# Patient Record
Sex: Female | Born: 1990 | Hispanic: Yes | Smoking: Former smoker
Health system: Southern US, Community
[De-identification: ages and names within clinical notes are randomized; demographics above are authoritative.]

## PROBLEM LIST (undated history)

## (undated) DIAGNOSIS — I10 Essential (primary) hypertension: Secondary | ICD-10-CM

## (undated) DIAGNOSIS — O24419 Gestational diabetes mellitus in pregnancy, unspecified control: Secondary | ICD-10-CM

## (undated) HISTORY — PX: NO PAST SURGERIES: SHX2092

## (undated) HISTORY — DX: Essential (primary) hypertension: I10

---

## 2017-12-15 NOTE — L&D Delivery Note (Addendum)
Patient is 27 y.o. [id number] admitted for IOL for oligohydramnios, A1GDM. . S/p IOL with foley bulb, cytotec, followed by Pitocin.  Prenatal course also complicated by history of Pre-eclampsia, limited prenatal care. .  Delivery Note At 7:52 AM a viable female was delivered via Vaginal, Spontaneous (Presentation: LOA ; cephalic).  APGAR: 8, 9; weight 6 lb 9.8 oz (3000 g).   Placenta status: placenta delivered precipitously after delivery of baby, indicating likely abruption.  3 vessel Cord:    Head delivered LOA. No nuchal cord present. Shoulder and body delivered in usual fashion. Body came out precipitously after head delivered followed by almost immediate delivery of the placenta.   Infant with spontaneous cry, placed on mother's abdomen, dried and bulb suctioned. Cord clamped x 2, and cut by family member. Cord blood drawn.Fundus firm with massage and Pitocin. Perineum inspected and found to have 2nd degree perineal laceration, which was repaired with 3.0 monocryl with good hemostasis achieved.   Anesthesia:  epidural Episiotomy: None Lacerations: 2nd degree;Perineal Suture Repair: 3.0 monocryl Est. Blood Loss (mL): 350  Mom to postpartum.  Baby to Couplet care / Skin to Skin.  Sandre KittyDaniel K Olson 07/21/2018, 9:56 AM  I confirm that I have verified the information documented in the resident's note and that I have also personally reperformed the physical exam and all medical decision making activities.  I was gloved and present for entire delivery SVD without incident No difficulty with shoulders Lacerations as listed above Repair of same supervised by me Aviva SignsMarie L Madalen Gavin, CNM Please schedule this patient for Postpartum visit in: 1 week with the following provider: Any provider For C/S patients schedule nurse incision check in weeks 2 weeks: no High risk pregnancy complicated by: HTN  And Diabetes Delivery mode:  SVD Anticipated Birth Control:  IUD PP Procedures needed: BP check   Schedule Integrated BH visit: no

## 2018-05-03 ENCOUNTER — Ambulatory Visit (INDEPENDENT_AMBULATORY_CARE_PROVIDER_SITE_OTHER): Payer: Self-pay | Admitting: Advanced Practice Midwife

## 2018-05-03 ENCOUNTER — Encounter: Payer: Self-pay | Admitting: *Deleted

## 2018-05-03 ENCOUNTER — Encounter: Payer: Self-pay | Admitting: Advanced Practice Midwife

## 2018-05-03 DIAGNOSIS — Z23 Encounter for immunization: Secondary | ICD-10-CM

## 2018-05-03 DIAGNOSIS — Z113 Encounter for screening for infections with a predominantly sexual mode of transmission: Secondary | ICD-10-CM

## 2018-05-03 DIAGNOSIS — O0990 Supervision of high risk pregnancy, unspecified, unspecified trimester: Secondary | ICD-10-CM | POA: Insufficient documentation

## 2018-05-03 DIAGNOSIS — O0992 Supervision of high risk pregnancy, unspecified, second trimester: Secondary | ICD-10-CM

## 2018-05-03 DIAGNOSIS — Z8759 Personal history of other complications of pregnancy, childbirth and the puerperium: Secondary | ICD-10-CM | POA: Insufficient documentation

## 2018-05-03 LAB — POCT URINALYSIS DIP (DEVICE)
BILIRUBIN URINE: NEGATIVE
Glucose, UA: NEGATIVE mg/dL
HGB URINE DIPSTICK: NEGATIVE
Ketones, ur: 15 mg/dL — AB
NITRITE: POSITIVE — AB
PH: 6.5 (ref 5.0–8.0)
Protein, ur: NEGATIVE mg/dL
Specific Gravity, Urine: 1.02 (ref 1.005–1.030)
Urobilinogen, UA: 0.2 mg/dL (ref 0.0–1.0)

## 2018-05-03 NOTE — Progress Notes (Signed)
Subjective:   Nichole Watts is a 27 y.o. G3P2002 at [redacted]w[redacted]d by LMP, irregular cycles (will confirm with [place]) being seen today for her first obstetrical visit.  Her obstetrical history is significant for pregnancy induced hypertension. Patient does intend to breast feed. Pregnancy history fully reviewed.  Patient reports no complaints.  HISTORY: OB History  Gravida Para Term Preterm AB Living  0 0 2  SAB TAB Ectopic Multiple Live Births  0 0 0 0 2    # Outcome Date GA Lbr Len/2nd Weight Sex Delivery Anes PTL Lv  3 Current           2 Term 07/16/17 [redacted]w[redacted]d  7 lb 5 oz (3.317 kg) F Vag-Spont None  LIV     Birth Comments: No complications except blood pressure elevated last few weeks of pregnancy  1 Term 07/15/11 [redacted]w[redacted]d  7 lb (3.175 kg) F Vag-Spont None  LIV     Birth Comments: no complications    Last pap smear was done approx one year ago and was normal per patient   Past Medical History:  Diagnosis Date  . Hypertension    during last pregnancy, last few weeks   History reviewed. No pertinent surgical history. Family History  Problem Relation Age of Onset  . Stroke Father   . Hypertension Father    Social History   Tobacco Use  . Smoking status: Never Smoker  . Smokeless tobacco: Never Used  Substance Use Topics  . Alcohol use: Never    Frequency: Never  . Drug use: Yes    Types: Marijuana    Comment: last in 03/2018   No Known Allergies Current Outpatient Medications on File Prior to Visit  Medication Sig Dispense Refill  . Prenatal Multivit-Min-Fe-FA (PRENATAL VITAMINS PO) Take 1 tablet by mouth daily.     No current facility-administered medications on file prior to visit.     Review of Systems Pertinent items noted in HPI and remainder of comprehensive ROS otherwise negative.  Exam   Vitals:   05/03/18 0848 05/03/18 0850  BP: 121/79   Pulse: 89   Weight: 222 lb 14.4 oz (101.1 kg)   Height:   (1.727 m)   Fetal Heart Rate (bpm):  145  Uterus:   23 cm  Pelvic Exam: Perineum: no hemorrhoids, normal perineum   Vulva: normal external genitalia, no lesions   Vagina:  normal mucosa, normal discharge   Cervix:    Adnexa:    Bony Pelvis:   System: General: well-developed, well-nourished female in no acute distress   Breast:  normal appearance, no masses or tenderness   Skin: normal coloration and turgor, no rashes   Neurologic: oriented, normal, negative, normal mood   Extremities: normal strength, tone, and muscle mass, ROM of all joints is normal   HEENT PERRLA, extraocular movement intact and sclera clear, anicteric   Mouth/Teeth mucous membranes moist, pharynx normal without lesions and dental hygiene good   Neck supple and no masses   Cardiovascular: regular rate and rhythm   Respiratory:  no respiratory distress, normal breath sounds   Abdomen: soft, non-tender; bowel sounds normal; no masses,  no organomegaly    Assessment:   Pregnancy: [id number] Patient Active Problem List   Diagnosis Date Noted  . Supervision of high risk pregnancy, antepartum 05/03/2018  . History of pregnancy induced hypertension 05/03/2018     Plan:  1. Supervision of high risk pregnancy, antepartum - [facility] AMB BABYSCRIPTS OPT IN -  Culture, OB Urine - Obstetric Panel, Including HIV - Hemoglobinopathy Evaluation - Flu Vaccine QUAD 36+ mos IM - GC/Chlamydia probe amp ([place])not at [facility] - OB US ordered at the Health Department  - ROI for pap results   2. History of pregnancy induced hypertension - [facility] AMB BABYSCRIPTS OPT IN - Culture, OB Urine - Obstetric Panel, Including HIV - Hemoglobinopathy Evaluation - Flu Vaccine QUAD 36+ mos IM - GC/Chlamydia probe amp ()not at [facility] - CMET - P:cr   Initial labs drawn. Continue prenatal vitamins. Genetic Screening discussed, First trimester screen, Quad screen and NIPS: too late for most, and declines panorama . Ultrasound discussed; fetal anatomic survey:  ordered. Problem list reviewed and updated. The nature of  - [facility] with multiple MDs and other Advanced Practice Providers was explained to patient; also emphasized that residents, students are part of our team. Routine obstetric precautions reviewed. > 50% of 45 min visit spent in counseling and coordination of care. Return in about 1 month (around 05/31/2018).

## 2018-05-03 NOTE — Patient Instructions (Addendum)
Ultrasound: 05/06/18 at the health department  [place], [place], [place] [address] [phone]   Childbirth Education Options: [facility] Department Classes:  Childbirth education classes can help you get ready for a positive parenting experience. You can also meet other expectant parents and get free stuff for your baby. Each class runs for five weeks on the same night and costs $45 for the mother-to-be and her support person. Medicaid covers the cost if you are eligible. Call [phone] to register. [facility] Childbirth Education:  [phone] or [phone] or [email or web address]  Baby & Me Class: Discuss newborn & infant parenting and family adjustment issues with other new mothers in a relaxed environment. Each week brings a new speaker or baby-centered activity. We encourage new mothers to join [place] every Thursday at 11:00am. Babies birth until crawling. No registration or fee. Daddy [employer]: This course offers Dads-to-be the tools and knowledge needed to feel confident on their journey to becoming new fathers. Experienced dads, who have been trained as coaches, teach dads-to-be how to hold, comfort, diaper, swaddle and play with their infant while being able to support the new mom as well. A class for men taught by men. $25/dad Big Brother/Big Sister: Let your children share in the joy of a new brother or sister in this special class designed just for them. Class includes discussion about how families care for babies: swaddling, holding, diapering, safety as well as how they can be helpful in their new role. This class is designed for children ages 63 to 7, but any age is welcome. Please register each child individually. $5/child  Mom Talk: This mom-led group offers support and connection to mothers as they journey through the adjustments and struggles of that sometimes overwhelming first year after the birth of a child. Tuesdays at 10:00am and Thursdays at  6:00pm. Babies welcome. No registration or fee. Breastfeeding Support Group: This group is a mother-to-mother support circle where moms have the opportunity to share their breastfeeding experiences. A Lactation Consultant is present for questions and concerns. Meets each Tuesday at 11:00am. No fee or registration. Breastfeeding Your Baby: Learn what to expect in the first days of breastfeeding your newborn.  This class will help you feel more confident with the skills needed to begin your breastfeeding experience. Many new mothers are concerned about breastfeeding after leaving the hospital. This class will also address the most common fears and challenges about breastfeeding during the first few weeks, months and beyond. (call for fee) Comfort Techniques and Tour: This 2 hour interactive class will provide you the opportunity to learn & practice hands-on techniques that can help relieve some of the discomfort of labor and encourage your baby to rotate toward the best position for birth. You and your partner will be able to try a variety of labor positions with birth balls and rebozos as well as practice breathing, relaxation, and visualization techniques. A tour of the [facility] is included with this class. $20 per registrant and support person Childbirth Class- Weekend Option: This class is a Weekend version of our Birth & Baby series. It is designed for parents who have a difficult time fitting several weeks of classes into their schedule. It covers the care of your newborn and the basics of labor and childbirth. It also includes a Love of [facility] and lunch. The class is held two consecutive days: beginning on Friday evening from 6:30 - 8:30 p.m. and the next day,  Saturday from 9 a.m. - 4 p.m. (call for fee) Waterbirth Class: Interested in a waterbirth?  This informational class will help you discover whether waterbirth is the right fit for you.  Education about waterbirth itself, supplies you would need and how to assemble your support team is what you can expect from this class. Some obstetrical practices require this class in order to pursue a waterbirth. (Not all obstetrical practices offer waterbirth-check with your healthcare provider.) Register only the expectant mom, but you are encouraged to bring your partner to class! Required if planning waterbirth, no fee. Infant/Child CPR: Parents, grandparents, babysitters, and friends learn Cardio-Pulmonary Resuscitation skills for infants and children. You will also learn how to treat both conscious and unconscious choking in infants and children. This Family & Friends program does not offer certification. Register each participant individually to ensure that enough mannequins are available. (Call for fee) Grandparent Love: Expecting a grandbaby? This class is for you! Learn about the latest infant care and safety recommendations and ways to support your own child as he or she transitions into the parenting role. Taught by Registered Nurses who are childbirth instructors, but most importantly...they are grandmothers too! $10/person. Childbirth Class- Natural Childbirth: This series of 5 weekly classes is for expectant parents who want to learn and practice natural methods of coping with the process of labor and childbirth. Relaxation, breathing, massage, visualization, role of the partner, and helpful positioning are highlighted. Participants learn how to be confident in their body's ability to give birth. This class will empower and help parents make informed decisions about their own care. Includes discussion that will help new parents transition into the immediate postpartum period. [facility] is included. We suggest taking this class between 25-32 weeks, but it's only a recommendation. $75 per registrant and one support person or $30 Medicaid. Childbirth Class- 3  week Series: This option of 3 weekly classes helps you and your labor partner prepare for childbirth. Newborn care, labor & birth, cesarean birth, pain management, and comfort techniques are discussed and a Marshall of [facility] is included. The class meets at the same time, on the same day of the week for 3 consecutive weeks beginning with the starting date you choose. $60 for registrant and one support person.  Marvelous Multiples: Expecting twins, triplets, or more? This class covers the differences in labor, birth, parenting, and breastfeeding issues that face multiples' parents. NICU tour is included. Led by a Certified Childbirth Educator who is the mother of twins. No fee. Caring for Baby: This class is for expectant and adoptive parents who want to learn and practice the most up-to-date newborn care for their babies. Focus is on birth through the first six weeks of life. Topics include feeding, bathing, diapering, crying, umbilical cord care, circumcision care and safe sleep. Parents learn to recognize symptoms of illness and when to call the pediatrician. Register only the mom-to-be and your partner or support person can plan to come with you! $10 per registrant and support person Childbirth Class- online option: This online class offers you the freedom to complete a Birth and Baby series in the comfort of your own home. The flexibility of this option allows you to review sections at your own pace, at times convenient to you and your support people. It includes additional video information, animations, quizzes, and extended activities. Get organized with helpful eClass tools, checklists, and trackers. Once you register online for the class, you will  receive an email within a few days to accept the invitation and begin the class when the time is right for you. The content will be available to you for 60 days. $60 for 60 days of online access for you and your support  people.  Local Doulas: Natural Baby Doulas [email or web address] Tel: [phone] [email or web address] [employer] [phone] [email or web address] .[email or web address] www.[email or web address].com The Labor Hassell Halim  (also do waterbirth tub rental) [phone] [email or web address] Triad Birth Doula [phone] [email or web address] NotebookDistributors.fi Sacred Rhythms  [phone] https://sacred-[email or web address]/ [employer] (PADA) [email or web address] https://www.[email or web address]/ [place] Birth and Baby  http://[email or web address]/ Considering Waterbirth? Guide for patients at [facility] [employer]  Why consider waterbirth?  . Gentle birth for babies . Less pain medicine used in labor . May allow for passive descent/less pushing . May reduce perineal tears  . More mobility and instinctive maternal position changes . Increased maternal relaxation . Reduced blood pressure in labor  Is waterbirth safe? What are the risks of infection, drowning or other complications?  . Infection: o Very low risk (3.7 % for tub vs 4.8% for bed) o 7 in 8000 waterbirths with documented infection o Poorly cleaned equipment most common cause o Slightly lower group B strep transmission rate  . Drowning o Maternal:  - Very low risk   - Related to seizures or fainting o Newborn:  - Very low risk. No evidence of increased risk of respiratory problems in multiple large studies - Physiological protection from breathing under water - Avoid underwater birth if there are any fetal complications - Once baby's head is out of the water, keep it out.  . Birth complication o Some reports of cord trauma, but risk decreased by bringing baby to surface gradually o No evidence of increased risk of shoulder dystocia. Mothers can usually change positions faster in water than in a bed, possibly aiding the  maneuvers to free the shoulder.   You must attend a Doren Custard class at [facility]  3rd Wednesday of every month from 7-9pm  [employer] by calling [phone] or online at [email or web address]  Bring [place] the certificate from the class to your prenatal appointment  Meet with a midwife at 36 weeks to see if you can still plan a waterbirth and to sign the consent.   Purchase or rent the following supplies:   Water Birth Pool (Birth Pool in a Box or [place] for instance)  (Tubs start ~$125)  Single-use disposable tub liner designed for your brand of tub  New garden hose labeled "lead-free", "suitable for drinking water",  Electric drain pump to remove water (We recommend 792 gallon per hour or greater pump.)   Separate garden hose to remove the dirty water  Fish net  Bathing suit top (optional)  Long-handled mirror (optional)  Places to purchase or rent supplies  GotWebTools.is for tub purchases and supplies  Waterbirthsolutions.com for tub purchases and supplies  The Labor Ladies (www.thelaborladies.com) $275 for tub rental/set-up & take down/kit   [employer] (http://www.fleming.com/.htm) Information regarding doulas (labor support) who provide pool rentals  Our practice has a Birth Pool in a Box tub at the hospital that you may borrow on a first-come-first-served basis. It is your responsibility to to set up, clean and break down the tub. We cannot guarantee the availability of this tub in advance. You are responsible for bringing all accessories listed above. If you do not have all necessary supplies you cannot have a waterbirth.    Things that would prevent you from having a  waterbirth:  Premature, <37wks  Previous cesarean birth  Presence of thick meconium-stained fluid  Multiple gestation (Twins, triplets, etc.)  Uncontrolled diabetes or gestational diabetes requiring medication  Hypertension requiring medication  or diagnosis of pre-eclampsia  Heavy vaginal bleeding  Non-reassuring fetal heart rate  Active infection (MRSA, etc.). Group B Strep is NOT a contraindication for  waterbirth.  If your labor has to be induced and induction method requires continuous  monitoring of the baby's heart rate  Other risks/issues identified by your obstetrical provider  Please remember that birth is unpredictable. Under certain unforeseeable circumstances your provider may advise against giving birth in the tub. These decisions will be made on a case-by-case basis and with the safety of you and your baby as our highest priority.

## 2018-05-03 NOTE — Progress Notes (Signed)
Here for new ob. Given new pregnancy packet. Sent MyChart text to sign up. Signed up for Babyscripts app only. Scheduled [place] at [facility] 05/06/18 10:00.

## 2018-05-04 LAB — GC/CHLAMYDIA PROBE AMP (~~LOC~~) NOT AT ARMC
CHLAMYDIA, DNA PROBE: NEGATIVE
Neisseria Gonorrhea: NEGATIVE

## 2018-05-05 LAB — OBSTETRIC PANEL, INCLUDING HIV
Antibody Screen: NEGATIVE
BASOS: 0 %
Basophils Absolute: 0 10*3/uL (ref 0.0–0.2)
EOS (ABSOLUTE): 0.1 10*3/uL (ref 0.0–0.4)
Eos: 1 %
HEMATOCRIT: 39.4 % (ref 34.0–46.6)
HEP B S AG: NEGATIVE
HIV Screen 4th Generation wRfx: NONREACTIVE
Hemoglobin: 13.3 g/dL (ref 11.1–15.9)
IMMATURE GRANS (ABS): 0.1 10*3/uL (ref 0.0–0.1)
IMMATURE GRANULOCYTES: 1 %
LYMPHS ABS: 1.8 10*3/uL (ref 0.7–3.1)
Lymphs: 16 %
MCH: 30.1 pg (ref 26.6–33.0)
MCHC: 33.8 g/dL (ref 31.5–35.7)
MCV: 89 fL (ref 79–97)
MONOCYTES: 8 %
Monocytes Absolute: 0.9 10*3/uL (ref 0.1–0.9)
NEUTROS ABS: 8.3 10*3/uL — AB (ref 1.4–7.0)
Neutrophils: 74 %
PLATELETS: 292 10*3/uL (ref 150–450)
RBC: 4.42 x10E6/uL (ref 3.77–5.28)
RDW: 13.2 % (ref 12.3–15.4)
RPR: NONREACTIVE
RUBELLA: 1.65 {index} (ref >0.99)
Rh Factor: POSITIVE
WBC: 11.3 10*3/uL — ABNORMAL HIGH (ref 3.4–10.8)

## 2018-05-05 LAB — COMPREHENSIVE METABOLIC PANEL
ALBUMIN: 3.6 g/dL (ref 3.5–5.5)
ALK PHOS: 87 IU/L (ref 39–117)
ALT: 13 IU/L (ref 0–32)
AST: 11 IU/L (ref 0–40)
Albumin/Globulin Ratio: 1.3 (ref 1.2–2.2)
BUN / CREAT RATIO: 13 (ref 9–23)
BUN: 7 mg/dL (ref 6–20)
Bilirubin Total: 0.2 mg/dL (ref 0.0–1.2)
CALCIUM: 9.1 mg/dL (ref 8.7–10.2)
CO2: 18 mmol/L — AB (ref 20–29)
CREATININE: 0.52 mg/dL — AB (ref 0.57–1.00)
Chloride: 106 mmol/L (ref 96–106)
GFR, EST AFRICAN AMERICAN: 152 mL/min/{1.73_m2} (ref >59)
GFR, EST NON AFRICAN AMERICAN: 132 mL/min/{1.73_m2} (ref >59)
GLUCOSE: 90 mg/dL (ref 65–99)
Globulin, Total: 2.8 g/dL (ref 1.5–4.5)
Potassium: 4.1 mmol/L (ref 3.5–5.2)
Sodium: 139 mmol/L (ref 134–144)
TOTAL PROTEIN: 6.4 g/dL (ref 6.0–8.5)

## 2018-05-05 LAB — HEMOGLOBINOPATHY EVALUATION
Ferritin: 20 ng/mL (ref 15–150)
HGB A: 97.6 % (ref 96.4–98.8)
HGB F QUANT: 0 % (ref 0.0–2.0)
HGB SOLUBILITY: NEGATIVE
Hgb A2 Quant: 2.4 % (ref 1.8–3.2)
Hgb C: 0 %
Hgb S: 0 %
Hgb Variant: 0 %

## 2018-05-05 LAB — PROTEIN / CREATININE RATIO, URINE
CREATININE, UR: 113.3 mg/dL
Protein, Ur: 17.9 mg/dL
Protein/Creat Ratio: 158 mg/g creat (ref 0–200)

## 2018-05-07 LAB — URINE CULTURE, OB REFLEX

## 2018-05-07 LAB — CULTURE, OB URINE

## 2018-05-07 MED ORDER — CEPHALEXIN 500 MG PO CAPS: 500.0000 mg | ORAL_CAPSULE | Freq: Four times a day (QID) | ORAL | 0 refills | Status: DC

## 2018-05-07 NOTE — Addendum Note (Signed)
Addended by: Thressa Sheller D on: 05/07/2018 08:06 PM   Modules accepted: Orders

## 2018-05-21 ENCOUNTER — Encounter: Payer: Self-pay | Admitting: *Deleted

## 2018-05-31 ENCOUNTER — Other Ambulatory Visit: Payer: Self-pay

## 2018-05-31 ENCOUNTER — Ambulatory Visit (INDEPENDENT_AMBULATORY_CARE_PROVIDER_SITE_OTHER): Payer: Self-pay | Admitting: Advanced Practice Midwife

## 2018-05-31 VITALS — BP 124/79 | HR 89 | Wt 226.0 lb

## 2018-05-31 DIAGNOSIS — O0990 Supervision of high risk pregnancy, unspecified, unspecified trimester: Secondary | ICD-10-CM

## 2018-05-31 DIAGNOSIS — O0993 Supervision of high risk pregnancy, unspecified, third trimester: Secondary | ICD-10-CM

## 2018-05-31 DIAGNOSIS — Z23 Encounter for immunization: Secondary | ICD-10-CM

## 2018-05-31 DIAGNOSIS — Z8759 Personal history of other complications of pregnancy, childbirth and the puerperium: Secondary | ICD-10-CM

## 2018-05-31 NOTE — Patient Instructions (Signed)
Tdap Vaccine (Tetanus, Diphtheria and Pertussis): What You Need to Know 1. Why get vaccinated? Tetanus, diphtheria and pertussis are very serious diseases. Tdap vaccine can protect us from these diseases. And, Tdap vaccine given to pregnant women can protect newborn babies against pertussis. TETANUS (Lockjaw) is rare in the [place] today. It causes painful muscle tightening and stiffness, usually all over the body.  It can lead to tightening of muscles in the head and neck so you can't open your mouth, swallow, or sometimes even breathe. Tetanus kills about 1 out of 10 people who are infected even after receiving the best medical care.  DIPHTHERIA is also rare in the [place] today. It can cause a thick coating to form in the back of the throat.  It can lead to breathing problems, heart failure, paralysis, and death.  PERTUSSIS (Whooping Cough) causes severe coughing spells, which can cause difficulty breathing, vomiting and disturbed sleep.  It can also lead to weight loss, incontinence, and rib fractures. Up to 2 in 100 adolescents and 5 in 100 adults with pertussis are hospitalized or have complications, which could include pneumonia or death.  These diseases are caused by bacteria. Diphtheria and pertussis are spread from person to person through secretions from coughing or sneezing. Tetanus enters the body through cuts, scratches, or wounds. Before vaccines, as many as 200,000 cases of diphtheria, 200,000 cases of pertussis, and hundreds of cases of tetanus, were reported in the [place] each year. Since vaccination began, reports of cases for tetanus and diphtheria have dropped by about 99% and for pertussis by about 80%. 2. Tdap vaccine Tdap vaccine can protect adolescents and adults from tetanus, diphtheria, and pertussis. One dose of Tdap is routinely given at age 11 or 12. People who did not get Tdap at that age should get it as soon as possible. Tdap is especially  important for healthcare professionals and anyone having close contact with a baby younger than 12 months. Pregnant women should get a dose of Tdap during every pregnancy, to protect the newborn from pertussis. Infants are most at risk for severe, life-threatening complications from pertussis. Another vaccine, called Td, protects against tetanus and diphtheria, but not pertussis. A Td booster should be given every 10 years. Tdap may be given as one of these boosters if you have never gotten Tdap before. Tdap may also be given after a severe cut or burn to prevent tetanus infection. Your doctor or the person giving you the vaccine can give you more information. Tdap may safely be given at the same time as other vaccines. 3. Some people should not get this vaccine  A person who has ever had a life-threatening allergic reaction after a previous dose of any diphtheria, tetanus or pertussis containing vaccine, OR has a severe allergy to any part of this vaccine, should not get Tdap vaccine. Tell the person giving the vaccine about any severe allergies.  Anyone who had coma or long repeated seizures within 7 days after a childhood dose of DTP or DTaP, or a previous dose of Tdap, should not get Tdap, unless a cause other than the vaccine was found. They can still get Td.  Talk to your doctor if you: ? have seizures or another nervous system problem, ? had severe pain or swelling after any vaccine containing diphtheria, tetanus or pertussis, ? ever had a condition called Guillain-Barr Syndrome (GBS), ? aren't feeling well on the day the shot is scheduled. 4. Risks With any medicine, including   vaccines, there is a chance of side effects. These are usually mild and go away on their own. Serious reactions are also possible but are rare. Most people who get Tdap vaccine do not have any problems with it. Mild problems following Tdap: (Did not interfere with activities)  Pain where the shot was given (about  3 in 4 adolescents or 2 in 3 adults)  Redness or swelling where the shot was given (about 1 person in 5)  Mild fever of at least 100.4F (up to about 1 in 25 adolescents or 1 in 100 adults)  Headache (about 3 or 4 people in 10)  Tiredness (about 1 person in 3 or 4)  Nausea, vomiting, diarrhea, stomach ache (up to 1 in 4 adolescents or 1 in 10 adults)  Chills, sore joints (about 1 person in 10)  Body aches (about 1 person in 3 or 4)  Rash, swollen glands (uncommon)  Moderate problems following Tdap: (Interfered with activities, but did not require medical attention)  Pain where the shot was given (up to 1 in 5 or 6)  Redness or swelling where the shot was given (up to about 1 in 16 adolescents or 1 in 12 adults)  Fever over 102F (about 1 in 100 adolescents or 1 in 250 adults)  Headache (about 1 in 7 adolescents or 1 in 10 adults)  Nausea, vomiting, diarrhea, stomach ache (up to 1 or 3 people in 100)  Swelling of the entire arm where the shot was given (up to about 1 in 500).  Severe problems following Tdap: (Unable to perform usual activities; required medical attention)  Swelling, severe pain, bleeding and redness in the arm where the shot was given (rare).  Problems that could happen after any vaccine:  People sometimes faint after a medical procedure, including vaccination. Sitting or lying down for about 15 minutes can help prevent fainting, and injuries caused by a fall. Tell your doctor if you feel dizzy, or have vision changes or ringing in the ears.  Some people get severe pain in the shoulder and have difficulty moving the arm where a shot was given. This happens very rarely.  Any medication can cause a severe allergic reaction. Such reactions from a vaccine are very rare, estimated at fewer than 1 in a million doses, and would happen within a few minutes to a few hours after the vaccination. As with any medicine, there is a very remote chance of a vaccine  causing a serious injury or death. The safety of vaccines is always being monitored. For more information, visit: www.cdc.gov/vaccinesafety/ 5. What if there is a serious problem? What should I look for? Look for anything that concerns you, such as signs of a severe allergic reaction, very high fever, or unusual behavior. Signs of a severe allergic reaction can include hives, swelling of the face and throat, difficulty breathing, a fast heartbeat, dizziness, and weakness. These would usually start a few minutes to a few hours after the vaccination. What should I do?  If you think it is a severe allergic reaction or other emergency that can't wait, call 9-1-1 or get the person to the nearest hospital. Otherwise, call your doctor.  Afterward, the reaction should be reported to the Vaccine Adverse Event Reporting System (VAERS). Your doctor might file this report, or you can do it yourself through the VAERS web site at www.vaers.hhs.gov, or by calling 1-800-822-7967. ? VAERS does not give medical advice. 6. The National Vaccine Injury Compensation Program The National   Vaccine Injury Compensation Program (VICP) is a federal program that was created to compensate people who may have been injured by certain vaccines. Persons who believe they may have been injured by a vaccine can learn about the program and about filing a claim by calling 1-579-464-1416 or visiting the VICP website at SpiritualWord.at. There is a time limit to file a claim for compensation. 7. How can I learn more?  Ask your doctor. He or she can give you the vaccine package insert or suggest other sources of information.  Call your local or state health department.  Contact the Centers for Disease Control and Prevention (CDC): ? Call 432-320-9752 (1-800-CDC-INFO) or ? Visit CDC's website at PicCapture.uy CDC Tdap Vaccine VIS (02/07/14) This information is not intended to replace advice given to you by your  health care provider. Make sure you discuss any questions you have with your health care provider. Document Released: 06/01/2012 Document Revised: 08/21/2016 Document Reviewed: 08/21/2016 Elsevier Interactive Patient Education  2017 [employer].  BENEFITS OF BREASTFEEDING Many women wonder if they should breastfeed. Research shows that breast milk contains the perfect balance of vitamins, protein and fat that your baby needs to grow. It also contains antibodies that help your baby's immune system to fight off viruses and bacteria and can reduce the risk of sudden infant death syndrome (SIDS). In addition, the colostrum (a fluid secreted from the breast in the first few days after delivery) helps your newborn's digestive system to grow and function well. Breast milk is easier to digest than formula. Also, if your baby is born preterm, breast milk can help to reduce both short- and long-term health problems. BENEFITS OF BREASTFEEDING FOR MOM . Breastfeeding causes a hormone to be released that helps the uterus to contract and return to its normal size more quickly. . It aids in postpartum weight loss, reduces risk of breast and ovarian cancer, heart disease and rheumatoid arthritis. . It decreases the amount of bleeding after the baby is born. benefits of breastfeeding for baby . Provides comfort and nutrition . Protects baby against - Obesity - Diabetes - Asthma - Childhood cancers - Heart disease - Ear infections - Diarrhea - Pneumonia - Stomach problems - Serious allergies - Skin rashes . Promotes growth and development . Reduces the risk of baby having Sudden Infant Death Syndrome (SIDS) only breastmilk for the first 6 months . Protects baby against diseases/allergies . It's the perfect amount for tiny bellies . It restores baby's energy . Provides the best nutrition for baby . Giving water or formula can make baby more likely to get sick, decrease Mom's milk supply, make baby  less content with breastfeeding Skin to Skin After delivery, the staff will place your baby on your chest. This helps with the following: . Regulates baby's temperature, breathing, heart rate and blood sugar . Increases Mom's milk supply . Promotes bonding . Keeps baby and Mom calm and decreases baby's crying Rooming In Your baby will stay in your room with you for the entire time you are in the hospital. This helps with the following: . Allows Mom to learn baby's feeding cues - Fluttering eyes - Sucking on tongue or hand - Rooting (opens mouth and turns head) - Nuzzling into the breast - Bringing hand to mouth . Allows breastfeeding on demand (when your baby is ready) . Helps baby to be calm and content . Ensures a good milk supply . Prevents complications with breastfeeding . Allows parents to learn to care for  baby . Allows you to request assistance with breastfeeding Importance of a good latch . Increases milk transfer to baby - baby gets enough milk . Ensures you have enough milk for your baby . Decreases nipple soreness . Don't use pacifiers and bottles - these cause baby to suck differently than breastfeeding . Promotes continuation of breastfeeding Risks of Formula Supplementation with Breastfeeding Giving your infant formula in addition to your breast-milk EXCEPT when medically necessary can lead to: Marland Kitchen Decreases your milk supply  . Loss of confidence in yourself for providing baby's nutrition  . Engorgement and possibly mastitis  . Asthma & allergies in the baby BREASTFEEDING FAQS How long should I breastfeed my baby? It is recommended that you provide your baby with breast milk only for the first 6 months and then continue for the first year and longer as desired. During the first few weeks after birth, your baby will need to feed 8-12 times every 24 hours, or every 2-3 hours. They will likely feed for 15-30 minutes. How can I help my baby begin breastfeeding? Babies  are born with an instinct to breastfeed. A healthy baby can begin breastfeeding right away without specific help. At the hospital, a nurse (or lactation consultant) will help you begin the process and will give you tips on good positioning. It may be helpful to take a breastfeeding class before you deliver in order to know what to expect. How can I help my baby latch on? In order to assist your baby in latching-on, cup your breast in your hand and stroke your baby's lower lip with your nipple to stimulate your baby's rooting reflex. Your baby will look like he or she is yawning, at which point you should bring the baby towards your breast, while aiming the nipple at the roof of his or her mouth. Remember to bring the baby towards you and not your breast towards the baby. How can I tell if my baby is latched-on? Your baby will have all of your nipple and part of the dark area around the nipple in his or her mouth and your baby's nose will be touching your breast. You should see or hear the baby swallowing. If the baby is not latched-on properly, start the process over. To remove the suction, insert a clean finger between your breast and the baby's mouth. Should I switch breasts during feeding? After feeding on one side, switch the baby to your other breast. If he or she does not continue feeding - that is OK. Your baby will not necessarily need to feed from both breasts in a single feeding. On the next feeding, start with the other breast for efficiency and comfort. How can I tell if my baby is hungry? When your baby is hungry, they will nuzzle against your breast, make sucking noises and tongue motions and may put their hands near their mouth. Crying is a late sign of hunger, so you should not wait until this point. When they have received enough milk, they will unlatch from the breast. Is it okay to use a pacifier? Until your baby gets the hang of breastfeeding, experts recommend limiting pacifier usage.  If you have questions about this, please contact your pediatrician. What can I do to ensure proper nutrition while breastfeeding? . Make sure that you support your own health and your baby's by eating a healthy, well-balanced diet . Your provider may recommend that you continue to take your prenatal vitamin . Drink plenty of fluids. It  is a good rule to drink one glass of water before or after feeding . Alcohol will remain in the breast milk for as long as it will remain in the blood stream. If you choose to have a drink, it is recommended that you wait at least 2 hours before feeding . Moderate amounts of caffeine are OK . Some over-the-counter or prescription medications are not recommended during breastfeeding. Check with your provider if you have questions What types of birth control methods are safe while breastfeeding? Progestin-only methods, including a daily pill, an IUD, the implant and the injection are safe while breastfeeding. Methods that contain estrogen (such as combination birth control pills, the vaginal ring and the patch) should not be used during the first month of breastfeeding as these can decrease your milk supply.

## 2018-05-31 NOTE — Progress Notes (Signed)
   PRENATAL VISIT NOTE  Subjective:  Nichole Watts is a 27 y.o. G3P2002 at 2283w6d being seen today for ongoing prenatal care.  She is currently monitored for the following issues for this low-risk pregnancy and has Supervision of high risk pregnancy, antepartum and History of pregnancy induced hypertension on their problem list.  Patient reports no complaints.  Contractions: Not present. Vag. Bleeding: None.  Movement: Present. Denies leaking of fluid.   The following portions of the patient's history were reviewed and updated as appropriate: allergies, current medications, past family history, past medical history, past social history, past surgical history and problem list. Problem list updated.  Objective:   Vitals:   05/31/18 1121  BP: 124/79  Pulse: 89  Weight: 226 lb (102.5 kg)    Fetal Status: Fetal Heart Rate (bpm): 145 Fundal Height: 30 cm Movement: Present     General:  Alert, oriented and cooperative. Patient is in no acute distress.  Skin: Skin is warm and dry. No rash noted.   Cardiovascular: Normal heart rate noted  Respiratory: Normal respiratory effort, no problems with respiration noted  Abdomen: Soft, gravid, appropriate for gestational age.  Pain/Pressure: Present     Pelvic: Cervical exam deferred        Extremities: Normal range of motion.  Edema: None  Mental Status: Normal mood and affect. Normal behavior. Normal judgment and thought content.   Assessment and Plan:  Pregnancy: G3P2002 at 7483w6d  1. Supervision of high risk pregnancy, antepartum - Tdap vaccine greater than or equal to 7yo IM - 28 week labs today   2. History of pregnancy induced hypertension - [facility] - Patient reports that she isn't taking ASA daily. States, "I take it sometimes". DW patient the rationale for this medication and encouraged her to be more consistent with taking it.    Preterm labor symptoms and general obstetric precautions including but not limited  to vaginal bleeding, contractions, leaking of fluid and fetal movement were reviewed in detail with the patient. Please refer to After Visit Summary for other counseling recommendations.  Return in about 2 weeks (around 06/14/2018).  No future appointments.  Thressa ShellerHeather Keyira Mondesir, CNM

## 2018-06-01 LAB — CBC
Hematocrit: 38.2 % (ref 34.0–46.6)
Hemoglobin: 13.2 g/dL (ref 11.1–15.9)
MCH: 30 pg (ref 26.6–33.0)
MCHC: 34.6 g/dL (ref 31.5–35.7)
MCV: 87 fL (ref 79–97)
PLATELETS: 310 10*3/uL (ref 150–450)
RBC: 4.4 x10E6/uL (ref 3.77–5.28)
RDW: 13.5 % (ref 12.3–15.4)
WBC: 9 10*3/uL (ref 3.4–10.8)

## 2018-06-01 LAB — GLUCOSE TOLERANCE, 2 HOURS W/ 1HR
Glucose, 1 hour: 106 mg/dL (ref 65–179)
Glucose, 2 hour: 99 mg/dL (ref 65–152)
Glucose, Fasting: 99 mg/dL — ABNORMAL HIGH (ref 65–91)

## 2018-06-01 LAB — RPR: RPR: NONREACTIVE

## 2018-06-01 LAB — HIV ANTIBODY (ROUTINE TESTING W REFLEX): HIV Screen 4th Generation wRfx: NONREACTIVE

## 2018-06-03 ENCOUNTER — Other Ambulatory Visit: Payer: Self-pay

## 2018-06-14 ENCOUNTER — Encounter: Payer: Self-pay | Admitting: Obstetrics & Gynecology

## 2018-06-21 ENCOUNTER — Encounter: Payer: Self-pay | Admitting: Advanced Practice Midwife

## 2018-06-21 ENCOUNTER — Ambulatory Visit (INDEPENDENT_AMBULATORY_CARE_PROVIDER_SITE_OTHER): Payer: Self-pay | Admitting: Family Medicine

## 2018-06-21 VITALS — BP 115/81 | HR 109 | Wt 230.6 lb

## 2018-06-21 DIAGNOSIS — Z8759 Personal history of other complications of pregnancy, childbirth and the puerperium: Secondary | ICD-10-CM

## 2018-06-21 DIAGNOSIS — O0993 Supervision of high risk pregnancy, unspecified, third trimester: Secondary | ICD-10-CM

## 2018-06-21 DIAGNOSIS — O0990 Supervision of high risk pregnancy, unspecified, unspecified trimester: Secondary | ICD-10-CM

## 2018-06-21 NOTE — Progress Notes (Signed)
   PRENATAL VISIT NOTE  Subjective:  Ranelle Oysterataly Ibarra Rodriguez is a 27 y.o. G3P2002 at 7853w6d being seen today for ongoing prenatal care.  She is currently monitored for the following issues for this high-risk pregnancy and has Supervision of high risk pregnancy, antepartum and History of pregnancy induced hypertension on their problem list.  Patient reports no complaints.  Contractions: Not present. Vag. Bleeding: None.  Movement: Present. Denies leaking of fluid.   The following portions of the patient's history were reviewed and updated as appropriate: allergies, current medications, past family history, past medical history, past social history, past surgical history and problem list. Problem list updated.  Objective:   Vitals:   06/21/18 1511  BP: 115/81  Pulse: (!) 109  Weight: 230 lb 9.6 oz (104.6 kg)    Fetal Status: Fetal Heart Rate (bpm): 133 Fundal Height: 34 cm Movement: Present     General:  Alert, oriented and cooperative. Patient is in no acute distress.  Skin: Skin is warm and dry. No rash noted.   Cardiovascular: Normal heart rate noted  Respiratory: Normal respiratory effort, no problems with respiration noted  Abdomen: Soft, gravid, appropriate for gestational age.  Pain/Pressure: Present     Pelvic: Cervical exam deferred        Extremities: Normal range of motion.  Edema: None  Mental Status: Normal mood and affect. Normal behavior. Normal judgment and thought content.   Assessment and Plan:  Pregnancy: G3P2002 at 2953w6d  1. Supervision of high risk pregnancy, antepartum - Routine care   Preterm labor symptoms and general obstetric precautions including but not limited to vaginal bleeding, contractions, leaking of fluid and fetal movement were reviewed in detail with the patient. Please refer to After Visit Summary for other counseling recommendations.  Return in about 2 weeks (around 07/05/2018).  No future appointments.  Levie HeritageJacob J Stinson, DO

## 2018-06-21 NOTE — Patient Instructions (Signed)

## 2018-07-07 ENCOUNTER — Encounter: Payer: Self-pay | Admitting: Student

## 2018-07-07 ENCOUNTER — Ambulatory Visit (INDEPENDENT_AMBULATORY_CARE_PROVIDER_SITE_OTHER): Payer: Self-pay | Admitting: Student

## 2018-07-07 VITALS — BP 137/84 | HR 99 | Wt 234.1 lb

## 2018-07-07 DIAGNOSIS — O0933 Supervision of pregnancy with insufficient antenatal care, third trimester: Secondary | ICD-10-CM | POA: Insufficient documentation

## 2018-07-07 DIAGNOSIS — O24419 Gestational diabetes mellitus in pregnancy, unspecified control: Secondary | ICD-10-CM | POA: Insufficient documentation

## 2018-07-07 DIAGNOSIS — O0990 Supervision of high risk pregnancy, unspecified, unspecified trimester: Secondary | ICD-10-CM

## 2018-07-07 DIAGNOSIS — Z113 Encounter for screening for infections with a predominantly sexual mode of transmission: Secondary | ICD-10-CM

## 2018-07-07 LAB — GLUCOSE, CAPILLARY: Glucose-Capillary: 101 mg/dL — ABNORMAL HIGH (ref 70–99)

## 2018-07-07 LAB — OB RESULTS CONSOLE GBS: GBS: NEGATIVE

## 2018-07-07 NOTE — Progress Notes (Signed)
   PRENATAL VISIT NOTE  Subjective:  Nichole Watts is a 27 y.o. G3P2002 at 163w1d being seen today for ongoing prenatal care.  She is currently monitored for the following issues for this high-risk pregnancy and has Supervision of high risk pregnancy, antepartum; History of pregnancy induced hypertension; Gestational diabetes mellitus (GDM) in third trimester; and Limited prenatal care in third trimester on their problem list.  Patient reports no complaints.  Contractions: Irritability. Vag. Bleeding: None.  Movement: Present. Denies leaking of fluid.   The following portions of the patient's history were reviewed and updated as appropriate: allergies, current medications, past family history, past medical history, past social history, past surgical history and problem list. Problem list updated.  Objective:   Vitals:   07/07/18 1128  BP: 137/84  Pulse: 99  Weight: 234 lb 1.6 oz (106.2 kg)    Fetal Status: Fetal Heart Rate (bpm): 146 Fundal Height: 36 cm Movement: Present  Presentation: Vertex  General:  Alert, oriented and cooperative. Patient is in no acute distress.  Skin: Skin is warm and dry. No rash noted.   Cardiovascular: Normal heart rate noted  Respiratory: Normal respiratory effort, no problems with respiration noted  Abdomen: Soft, gravid, appropriate for gestational age.  Pain/Pressure: Present     Pelvic: Cervical exam performed Dilation: 1 Effacement (%): Thick Station: -3  Extremities: Normal range of motion.  Edema: Trace  Mental Status: Normal mood and affect. Normal behavior. Normal judgment and thought content.   Assessment and Plan:  Pregnancy: G3P2002 at 9563w1d  1. Supervision of high risk pregnancy, antepartum  - Culture, beta strep (group b only) - GC/Chlamydia probe amp ([place])[facility] at [facility]  2. Gestational diabetes mellitus (GDM) in third trimester, gestational diabetes method of control unspecified -Pt didn't receive phone call regarding  GDM diagnosis & need for education visit. GDM added to problem list. Random CBG today in office = 101 --- pt states she ate prior to coming for her appt -Ultrasound ordered -Pt to return tomorrow afternoon to meet with Bev for diabetes/nutrition education - POCT Glucose (CBG) - US MFM OB DETAIL +14 WK; Future  Term labor symptoms and general obstetric precautions including but not limited to vaginal bleeding, contractions, leaking of fluid and fetal movement were reviewed in detail with the patient. Please refer to After Visit Summary for other counseling recommendations.  Return in about 1 week (around 07/14/2018) for High Risk OB with MD to review blood sugars.  Future Appointments  Date Time Provider Department Center  07/08/2018 10:30 AM [facility]-MFC US 1 WH-MFCUS MFC-US  07/08/2018  4:00 PM WOC-EDUCATION [facility]A WOC  07/22/2018  9:15 AM Crisoforo OxfordKooistra, Charlesetta GaribaldiKathryn Lorraine, CNM [facility]OC  07/29/2018  9:15 AM Katrinka BlazingSmith, [place], CNM [facility]    Judeth HornErin Maripaz Mullan, NP

## 2018-07-07 NOTE — Patient Instructions (Signed)
Gestational Diabetes Mellitus, Self Care Caring for yourself after you have been diagnosed with gestational diabetes (gestational diabetes mellitus) means keeping your blood sugar (glucose) under control with a balance of:  Nutrition.  Exercise.  Lifestyle changes.  Medicines or insulin, if necessary.  Support from your team of health care providers and others.  The following information explains what you need to know to manage your gestational diabetes at home. What do I need to do to manage my blood glucose?  Check your blood glucose every day during your pregnancy. Do this as often as told by your health care provider.  Contact your health care provider if your blood glucose is above your target for 2 tests in a row. Your health care provider will set individualized treatment goals for you. Generally, the goal of treatment is to maintain the following blood glucose levels during pregnancy:  After not eating for 8 hours (after fasting): at or below 95 mg/dL (5.3 mmol/L).  After meals (postprandial): ? One hour after a meal: at or below 140 mg/dL (7.8 mmol/L). ? Two hours after a meal: at or below 120 mg/dL (6.7 mmol/L).  A1c (hemoglobin A1c) level: 6-6.5%.  What do I need to know about hyperglycemia and hypoglycemia? What is hyperglycemia? Hyperglycemia, also called high blood glucose, occurs when blood glucose is too high. Make sure you know the early signs of hyperglycemia, such as:  Increased thirst.  Hunger.  Feeling very tired.  Needing to urinate more often than usual.  Blurry vision.  What is hypoglycemia? Hypoglycemia, also called low blood glucose, occurswith a blood glucose level at or below 70 mg/dL (3.9 mmol/L). The risk for hypoglycemia increases during or after exercise, during sleep, during illness, and when skipping meals or not eating for a long time (fasting). It is important to know the symptoms of hypoglycemia and treat it right away. Always have a  15-gram rapid-acting carbohydrate snack with you to treat low blood glucose.Family members and close friends should also know the symptoms and should understand how to treat hypoglycemia, in case you are not able to treat yourself. What are the symptoms of hypoglycemia? Hypoglycemia symptoms can include:  Hunger.  Anxiety.  Sweating and feeling clammy.  Confusion.  Dizziness or feeling light-headed.  Sleepiness.  Nausea.  Increased heart rate.  Headache.  Blurry vision.  Seizure.  Nightmares.  Tingling or numbness around the mouth, lips, or tongue.  A change in speech.  Decreased ability to concentrate.  A change in coordination.  Restless sleep.  Tremors or shakes.  Fainting.  Irritability.  How do I treat hypoglycemia?  If you are alert and able to swallow safely, follow the 15:15 rule:  Take 15 grams of a rapid-acting carbohydrate. Rapid-acting options include: ? 1 tube of glucose gel. ? 3 glucose pills. ? 6-8 pieces of hard candy. ? 4 oz (120 mL) of fruit juice. ? 4 oz (120 mL) of regular (not diet) soda.  Check your blood glucose 15 minutes after you take the carbohydrate.  If the repeat blood glucose level is still at or below 70 mg/dL (3.9 mmol/L), take 15 grams of a carbohydrate again.  If your blood glucose level does not increase above 70 mg/dL (3.9 mmol/L) after 3 tries, seek emergency medical care.  After your blood glucose level returns to normal, eat a meal or a snack within 1 hour.  How do I treat severe hypoglycemia? Severe hypoglycemia is when your blood glucose level is at or below 54 mg/dL (  3 mmol/L). Severe hypoglycemia is an emergency. Do not wait to see if the symptoms will go away. Get medical help right away. Call your local emergency services (911 in the [place] Do not drive yourself to the hospital. If you have severe hypoglycemia and you cannot eat or drink, you may need an injection of glucagon. A family member or close  friend should learn how to check your blood glucose and how to give you a glucagon injection. Ask your health care provider if you need to have an emergency glucagon injection kit available. Severe hypoglycemia may need to be treated in a hospital. The treatment may include getting glucose through an IV tube. You may also need treatment for the cause of your hypoglycemia. What else can I do to manage my gestational diabetes? Take your diabetes medicines as told  If your health care provider prescribed insulin or diabetes medicines, take them every day.  Do not run out of insulin or other diabetes medicines that you take. Plan ahead so you always have these available.  If you use insulin, adjust your dosage based on how physically active you are and what foods you eat. Your health care provider will tell you how to adjust your dosage. Make healthy food choices  The things that you eat and drink affect your blood glucose. Making good choices helps to control your diabetes and prevent other health problems. A healthy meal plan includes eating lean proteins, complex carbohydrates, fresh fruits and vegetables, low-fat dairy products, and healthy fats. Make an appointment to see a diet and nutrition specialist (registered dietitian) to help you create an eating plan that is right for you. Make sure that you:  Follow instructions from your health care provider about eating or drinking restrictions.  Drink enough fluid to keep your urine clear or pale yellow.  Eat healthy snacks between nutritious meals.  Track the carbohydrates that you eat. Do this by reading food labels and learning the standard serving sizes of foods.  Follow your sick day plan whenever you cannot eat or drink as usual. Make this plan in advance with your health care provider.  Stay active   Do at least 30 minutes of physical activity a day, or as much physical activity as your health care provider recommends during your  pregnancy. ? Doing 10 minutes of exercise 30 minutes after each meal may help to control postprandial blood glucose levels.  If you start a new exercise or activity, work with your health care provider to adjust your insulin, medicines, or food intake as needed. Make healthy lifestyle choices  Do not drink alcohol.  Do not use any tobacco products, such as cigarettes, chewing tobacco, and e-cigarettes. If you need help quitting, ask your health care provider.  Learn to manage stress. If you need help with this, ask your health care provider. Care for your body  Keep your immunizations up to date.  Brush your teeth and gums two times a day, and floss at least one time a day.  Visit your dentist at least once every 6 months.  Maintain a healthy weight during your pregnancy. General instructions   Take over-the-counter and prescription medicines only as told by your health care provider.  Talk with your health care provider about your risk for high blood pressure during pregnancy (preeclampsia or eclampsia).  Share your diabetes management plan with people in your workplace, school, and household.  Check your urine for ketones during your pregnancy when you are ill and   as told by your health care provider.  Carry a medical alert card or wear medical alert jewelry.  Ask your health care provider: ? Do I need to meet with a diabetes educator? ? Where can I find a support group for people with diabetes?  Keep all follow-up visits during your pregnancy (prenatal) and after delivery (postnatal) as told by your health care provider. This is important. Get the care that you need after delivery  Have your blood glucose level checked 4-12 weeks after delivery. This is done with an oral glucose tolerance test (OGTT).  Get screened for diabetes at least every 3 years, or as often as told by your health care provider. Where to find more information: To learn more about gestational  diabetes, visit:  American Diabetes Association (ADA): www.diabetes.org/diabetes-basics/gestational  Centers for Disease Control and Prevention (CDC): www.cdc.gov/diabetes/pubs/pdf/gestationalDiabetes.pdf  This information is not intended to replace advice given to you by your health care provider. Make sure you discuss any questions you have with your health care provider. Document Released: 03/24/2016 Document Revised: 05/08/2016 Document Reviewed: 01/04/2016 [employer] Interactive Patient Education  2018 [employer]  

## 2018-07-08 ENCOUNTER — Ambulatory Visit (HOSPITAL_COMMUNITY): Payer: Self-pay | Attending: Student

## 2018-07-08 ENCOUNTER — Other Ambulatory Visit: Payer: Self-pay

## 2018-07-08 LAB — GC/CHLAMYDIA PROBE AMP (~~LOC~~) NOT AT ARMC
CHLAMYDIA, DNA PROBE: NEGATIVE
NEISSERIA GONORRHEA: NEGATIVE

## 2018-07-11 LAB — CULTURE, BETA STREP (GROUP B ONLY): STREP GP B CULTURE: NEGATIVE

## 2018-07-15 ENCOUNTER — Ambulatory Visit (INDEPENDENT_AMBULATORY_CARE_PROVIDER_SITE_OTHER): Payer: Self-pay | Admitting: Obstetrics and Gynecology

## 2018-07-15 ENCOUNTER — Encounter: Payer: Self-pay | Admitting: Obstetrics and Gynecology

## 2018-07-15 VITALS — BP 138/83 | HR 108 | Wt 234.3 lb

## 2018-07-15 DIAGNOSIS — O0993 Supervision of high risk pregnancy, unspecified, third trimester: Secondary | ICD-10-CM

## 2018-07-15 DIAGNOSIS — O0990 Supervision of high risk pregnancy, unspecified, unspecified trimester: Secondary | ICD-10-CM

## 2018-07-15 DIAGNOSIS — O24419 Gestational diabetes mellitus in pregnancy, unspecified control: Secondary | ICD-10-CM

## 2018-07-15 DIAGNOSIS — O0933 Supervision of pregnancy with insufficient antenatal care, third trimester: Secondary | ICD-10-CM

## 2018-07-15 DIAGNOSIS — Z8759 Personal history of other complications of pregnancy, childbirth and the puerperium: Secondary | ICD-10-CM

## 2018-07-15 LAB — POCT URINALYSIS DIP (DEVICE)
BILIRUBIN URINE: NEGATIVE
Glucose, UA: NEGATIVE mg/dL
Ketones, ur: NEGATIVE mg/dL
Nitrite: NEGATIVE
Protein, ur: NEGATIVE mg/dL
Specific Gravity, Urine: 1.02 (ref 1.005–1.030)
UROBILINOGEN UA: 0.2 mg/dL (ref 0.0–1.0)
pH: 6.5 (ref 5.0–8.0)

## 2018-07-15 LAB — GLUCOSE, CAPILLARY: GLUCOSE-CAPILLARY: 108 mg/dL — AB (ref 70–99)

## 2018-07-15 NOTE — Patient Instructions (Addendum)
Vaginal Delivery Vaginal delivery means that you will give birth by pushing your baby out of your birth canal (vagina). A team of health care providers will help you before, during, and after vaginal delivery. Birth experiences are unique for every woman and every pregnancy, and birth experiences vary depending on where you choose to give birth. What should I do to prepare for my baby's birth? Before your baby is born, it is important to talk with your health care provider about:  Your labor and delivery preferences. These may include: ? Medicines that you may be given. ? How you will manage your pain. This might include non-medical pain relief techniques or injectable pain relief such as epidural analgesia. ? How you and your baby will be monitored during labor and delivery. ? Who may be in the labor and delivery room with you. ? Your feelings about surgical delivery of your baby (cesarean delivery, or C-section) if this becomes necessary. ? Your feelings about receiving donated blood through an IV tube (blood transfusion) if this becomes necessary.  Whether you are able: ? To take pictures or videos of the birth. ? To eat during labor and delivery. ? To move around, walk, or change positions during labor and delivery.  What to expect after your baby is born, such as: ? Whether delayed umbilical cord clamping and cutting is offered. ? Who will care for your baby right after birth. ? Medicines or tests that may be recommended for your baby. ? Whether breastfeeding is supported in your hospital or birth center. ? How long you will be in the hospital or birth center.  How any medical conditions you have may affect your baby or your labor and delivery experience.  To prepare for your baby's birth, you should also:  Attend all of your health care visits before delivery (prenatal visits) as recommended by your health care provider. This is important.  Prepare your home for your baby's  arrival. Make sure that you have: ? Diapers. ? Baby clothing. ? Feeding equipment. ? Safe sleeping arrangements for you and your baby.  Install a car seat in your vehicle. Have your car seat checked by a certified car seat installer to make sure that it is installed safely.  Think about who will help you with your new baby at home for at least the first several weeks after delivery.  What can I expect when I arrive at the birth center or hospital? Once you are in labor and have been admitted into the hospital or birth center, your health care provider may:  Review your pregnancy history and any concerns you have.  Insert an IV tube into one of your veins. This is used to give you fluids and medicines.  Check your blood pressure, pulse, temperature, and heart rate (vital signs).  Check whether your bag of water (amniotic sac) has broken (ruptured).  Talk with you about your birth plan and discuss pain control options.  Monitoring Your health care provider may monitor your contractions (uterine monitoring) and your baby's heart rate (fetal monitoring). You may need to be monitored:  Often, but not continuously (intermittently).  All the time or for long periods at a time (continuously). Continuous monitoring may be needed if: ? You are taking certain medicines, such as medicine to relieve pain or make your contractions stronger. ? You have pregnancy or labor complications.  Monitoring may be done by:  Placing a special stethoscope or a handheld monitoring device on your abdomen to   check your baby's heartbeat, and feeling your abdomen for contractions. This method of monitoring does not continuously record your baby's heartbeat or your contractions.  Placing monitors on your abdomen (external monitors) to record your baby's heartbeat and the frequency and length of contractions. You may not have to wear external monitors all the time.  Placing monitors inside of your uterus  (internal monitors) to record your baby's heartbeat and the frequency, length, and strength of your contractions. ? Your health care provider may use internal monitors if he or she needs more information about the strength of your contractions or your baby's heart rate. ? Internal monitors are put in place by passing a thin, flexible wire through your vagina and into your uterus. Depending on the type of monitor, it may remain in your uterus or on your baby's head until birth. ? Your health care provider will discuss the benefits and risks of internal monitoring with you and will ask for your permission before inserting the monitors.  Telemetry. This is a type of continuous monitoring that can be done with external or internal monitors. Instead of having to stay in bed, you are able to move around during telemetry. Ask your health care provider if telemetry is an option for you.  Physical exam Your health care provider may perform a physical exam. This may include:  Checking whether your baby is positioned: ? With the head toward your vagina (head-down). This is most common. ? With the head toward the top of your uterus (head-up or breech). If your baby is in a breech position, your health care provider may try to turn your baby to a head-down position so you can deliver vaginally. If it does not seem that your baby can be born vaginally, your provider may recommend surgery to deliver your baby. In rare cases, you may be able to deliver vaginally if your baby is head-up (breech delivery). ? Lying sideways (transverse). Babies that are lying sideways cannot be delivered vaginally.  Checking your cervix to determine: ? Whether it is thinning out (effacing). ? Whether it is opening up (dilating). ? How low your baby has moved into your birth canal.  What are the three stages of labor and delivery?  Normal labor and delivery is divided into the following three stages: Stage 1  Stage 1 is the  longest stage of labor, and it can last for hours or days. Stage 1 includes: ? Early labor. This is when contractions may be irregular, or regular and mild. Generally, early labor contractions are more than 10 minutes apart. ? Active labor. This is when contractions get longer, more regular, more frequent, and more intense. ? The transition phase. This is when contractions happen very close together, are very intense, and may last longer than during any other part of labor.  Contractions generally feel mild, infrequent, and irregular at first. They get stronger, more frequent (about every 2-3 minutes), and more regular as you progress from early labor through active labor and transition.  Many women progress through stage 1 naturally, but you may need help to continue making progress. If this happens, your health care provider may talk with you about: ? Rupturing your amniotic sac if it has not ruptured yet. ? Giving you medicine to help make your contractions stronger and more frequent.  Stage 1 ends when your cervix is completely dilated to 4 inches (10 cm) and completely effaced. This happens at the end of the transition phase. Stage 2  Once   your cervix is completely effaced and dilated to 4 inches (10 cm), you may start to feel an urge to push. It is common for the body to naturally take a rest before feeling the urge to push, especially if you received an epidural or certain other pain medicines. This rest period may last for up to 1-2 hours, depending on your unique labor experience.  During stage 2, contractions are generally less painful, because pushing helps relieve contraction pain. Instead of contraction pain, you may feel stretching and burning pain, especially when the widest part of your baby's head passes through the vaginal opening (crowning).  Your health care provider will closely monitor your pushing progress and your baby's progress through the vagina during stage 2.  Your  health care provider may massage the area of skin between your vaginal opening and anus (perineum) or apply warm compresses to your perineum. This helps it stretch as the baby's head starts to crown, which can help prevent perineal tearing. ? In some cases, an incision may be made in your perineum (episiotomy) to allow the baby to pass through the vaginal opening. An episiotomy helps to make the opening of the vagina larger to allow more room for the baby to fit through.  It is very important to breathe and focus so your health care provider can control the delivery of your baby's head. Your health care provider may have you decrease the intensity of your pushing, to help prevent perineal tearing.  After delivery of your baby's head, the shoulders and the rest of the body generally deliver very quickly and without difficulty.  Once your baby is delivered, the umbilical cord may be cut right away, or this may be delayed for 1-2 minutes, depending on your baby's health. This may vary among health care providers, hospitals, and birth centers.  If you and your baby are healthy enough, your baby may be placed on your chest or abdomen to help maintain the baby's temperature and to help you bond with each other. Some mothers and babies start breastfeeding at this time. Your health care team will dry your baby and help keep your baby warm during this time.  Your baby may need immediate care if he or she: ? Showed signs of distress during labor. ? Has a medical condition. ? Was born too early (prematurely). ? Had a bowel movement before birth (meconium). ? Shows signs of difficulty transitioning from being inside the uterus to being outside of the uterus. If you are planning to breastfeed, your health care team will help you begin a feeding. Stage 3  The third stage of labor starts immediately after the birth of your baby and ends after you deliver the placenta. The placenta is an organ that develops  during pregnancy to provide oxygen and nutrients to your baby in the womb.  Delivering the placenta may require some pushing, and you may have mild contractions. Breastfeeding can stimulate contractions to help you deliver the placenta.  After the placenta is delivered, your uterus should tighten (contract) and become firm. This helps to stop bleeding in your uterus. To help your uterus contract and to control bleeding, your health care provider may: ? Give you medicine by injection, through an IV tube, by mouth, or through your rectum (rectally). ? Massage your abdomen or perform a vaginal exam to remove any blood clots that are left in your uterus. ? Empty your bladder by placing a thin, flexible tube (catheter) into your bladder. ? Encourage   you to breastfeed your baby. After labor is over, you and your baby will be monitored closely to ensure that you are both healthy until you are ready to go home. Your health care team will teach you how to care for yourself and your baby. This information is not intended to replace advice given to you by your health care provider. Make sure you discuss any questions you have with your health care provider. Document Released: 09/09/2008 Document Revised: 06/20/2016 Document Reviewed: 12/16/2015 Elsevier Interactive Patient Education  2018 [employer]. Gestational Diabetes Mellitus, Diagnosis Gestational diabetes (gestational diabetes mellitus) is a short-term (temporary) form of diabetes that can happen during pregnancy. It goes away after you give birth. It may be caused by one or both of these problems:  Your body does not make enough of a hormone called insulin.  Your body does not respond in a normal way to insulin that it makes.  Insulin lets sugars (glucose) go into cells in the body. This gives you energy. If you have diabetes, sugars cannot get into cells. This causes high blood sugar (hyperglycemia). If diabetes is treated, it may not hurt you  or your baby. Your doctor will set treatment goals for you. In general, you should have these blood sugar levels:  After not eating for a long time (fasting): 95 mg/dL (5.3 mmol/L).  After meals (postprandial): ? One hour after a meal: at or below 140 mg/dL (7.8 mmol/L). ? Two hours after a meal: at or below 120 mg/dL (6.7 mmol/L).  A1c (hemoglobin A1c) level: 6-6.5%.  Follow these instructions at home: Questions to Ask Your Doctor  You may want to ask these questions:  Do I need to meet with a diabetes educator?  Where can I find a support group for people with diabetes?  What equipment will I need to care for myself at home?  What diabetes medicines do I need? When should I take them?  How often do I need to check my blood sugar?  What number can I call if I have questions?  When is my next doctor's visit?  General instructions  Take over-the-counter and prescription medicines only as told by your doctor.  Stay at a healthy weight during pregnancy.  Keep all follow-up visits as told by your doctor. This is important. Contact a doctor if:  Your blood sugar is at or above 240 mg/dL (21.3 mmol/L).  Your blood sugar is at or above 200 mg/dL (08.6 mmol/L) and you have ketones in your pee (urine).  You have been sick or have had a fever for 2 days or more and you are not getting better.  You have any of these problems for more than 6 hours: ? You cannot eat or drink. ? You feel sick to your stomach (nauseous). ? You throw up (vomit). ? You have watery poop (diarrhea). Get help right away if:  Your blood sugar is lower than 54 mg/dL (3 mmol/L).  You get confused.  You have trouble: ? Thinking clearly. ? Breathing.  Your baby moves less than normal.  You have: ? Moderate or large ketone levels in your pee (urine). ? Bleeding from your vagina. ? Unusual fluid coming from your vagina. ? Early contractions. These may feel like tightness in your belly. This  information is not intended to replace advice given to you by your health care provider. Make sure you discuss any questions you have with your health care provider. Document Released: 03/24/2016 Document Revised: 05/08/2016 Document Reviewed:  01/04/2016 Elsevier Interactive Patient Education  2018 [employer]  Breastfeeding Choosing to breastfeed is one of the best decisions you can make for yourself and your baby. A change in hormones during pregnancy causes your breasts to make breast milk in your milk-producing glands. Hormones prevent breast milk from being released before your baby is born. They also prompt milk flow after birth. Once breastfeeding has begun, thoughts of your baby, as well as his or her sucking or crying, can stimulate the release of milk from your milk-producing glands. Benefits of breastfeeding Research shows that breastfeeding offers many health benefits for infants and mothers. It also offers a cost-free and convenient way to feed your baby. For your baby  Your first milk (colostrum) helps your baby's digestive system to function better.  Special cells in your milk (antibodies) help your baby to fight off infections.  Breastfed babies are less likely to develop asthma, allergies, obesity, or type 2 diabetes. They are also at lower risk for sudden infant death syndrome (SIDS).  Nutrients in breast milk are better able to meet your baby's needs compared to infant formula.  Breast milk improves your baby's brain development. For you  Breastfeeding helps to create a very special bond between you and your baby.  Breastfeeding is convenient. Breast milk costs nothing and is always available at the correct temperature.  Breastfeeding helps to burn calories. It helps you to lose the weight that you gained during pregnancy.  Breastfeeding makes your uterus return faster to its size before pregnancy. It also slows bleeding (lochia) after you give birth.  Breastfeeding  helps to lower your risk of developing type 2 diabetes, osteoporosis, rheumatoid arthritis, cardiovascular disease, and breast, ovarian, uterine, and endometrial cancer later in life. Breastfeeding basics Starting breastfeeding  Find a comfortable place to sit or lie down, with your neck and back well-supported.  Place a pillow or a rolled-up blanket under your baby to bring him or her to the level of your breast (if you are seated). Nursing pillows are specially designed to help support your arms and your baby while you breastfeed.  Make sure that your baby's tummy (abdomen) is facing your abdomen.  Gently massage your breast. With your fingertips, massage from the outer edges of your breast inward toward the nipple. This encourages milk flow. If your milk flows slowly, you may need to continue this action during the feeding.  Support your breast with 4 fingers underneath and your thumb above your nipple (make the letter "C" with your hand). Make sure your fingers are well away from your nipple and your baby's mouth.  Stroke your baby's lips gently with your finger or nipple.  When your baby's mouth is open wide enough, quickly bring your baby to your breast, placing your entire nipple and as much of the areola as possible into your baby's mouth. The areola is the colored area around your nipple. ? More areola should be visible above your baby's upper lip than below the lower lip. ? Your baby's lips should be opened and extended outward (flanged) to ensure an adequate, comfortable latch. ? Your baby's tongue should be between his or her lower gum and your breast.  Make sure that your baby's mouth is correctly positioned around your nipple (latched). Your baby's lips should create a seal on your breast and be turned out (everted).  It is common for your baby to suck about 2-3 minutes in order to start the flow of breast milk. Latching Teaching your  baby how to latch onto your breast properly  is very important. An improper latch can cause nipple pain, decreased milk supply, and poor weight gain in your baby. Also, if your baby is not latched onto your nipple properly, he or she may swallow some air during feeding. This can make your baby fussy. Burping your baby when you switch breasts during the feeding can help to get rid of the air. However, teaching your baby to latch on properly is still the best way to prevent fussiness from swallowing air while breastfeeding. Signs that your baby has successfully latched onto your nipple  Silent tugging or silent sucking, without causing you pain. Infant's lips should be extended outward (flanged).  Swallowing heard between every 3-4 sucks once your milk has started to flow (after your let-down milk reflex occurs).  Muscle movement above and in front of his or her ears while sucking.  Signs that your baby has not successfully latched onto your nipple  Sucking sounds or smacking sounds from your baby while breastfeeding.  Nipple pain.  If you think your baby has not latched on correctly, slip your finger into the corner of your baby's mouth to break the suction and place it between your baby's gums. Attempt to start breastfeeding again. Signs of successful breastfeeding Signs from your baby  Your baby will gradually decrease the number of sucks or will completely stop sucking.  Your baby will fall asleep.  Your baby's body will relax.  Your baby will retain a small amount of milk in his or her mouth.  Your baby will let go of your breast by himself or herself.  Signs from you  Breasts that have increased in firmness, weight, and size 1-3 hours after feeding.  Breasts that are softer immediately after breastfeeding.  Increased milk volume, as well as a change in milk consistency and color by the fifth day of breastfeeding.  Nipples that are not sore, cracked, or bleeding.  Signs that your baby is getting enough milk  Wetting  at least 1-2 diapers during the first 24 hours after birth.  Wetting at least 5-6 diapers every 24 hours for the first week after birth. The urine should be clear or pale yellow by the age of 5 days.  Wetting 6-8 diapers every 24 hours as your baby continues to grow and develop.  At least 3 stools in a 24-hour period by the age of 5 days. The stool should be soft and yellow.  At least 3 stools in a 24-hour period by the age of 7 days. The stool should be seedy and yellow.  No loss of weight greater than 10% of birth weight during the first 3 days of life.  Average weight gain of 4-7 oz (113-198 g) per week after the age of 4 days.  Consistent daily weight gain by the age of 5 days, without weight loss after the age of 2 weeks. After a feeding, your baby may spit up a small amount of milk. This is normal. Breastfeeding frequency and duration Frequent feeding will help you make more milk and can prevent sore nipples and extremely full breasts (breast engorgement). Breastfeed when you feel the need to reduce the fullness of your breasts or when your baby shows signs of hunger. This is called "breastfeeding on demand." Signs that your baby is hungry include:  Increased alertness, activity, or restlessness.  Movement of the head from side to side.  Opening of the mouth when the corner of the  mouth or cheek is stroked (rooting).  Increased sucking sounds, smacking lips, cooing, sighing, or squeaking.  Hand-to-mouth movements and sucking on fingers or hands.  Fussing or crying.  Avoid introducing a pacifier to your baby in the first 4-6 weeks after your baby is born. After this time, you may choose to use a pacifier. Research has shown that pacifier use during the first year of a baby's life decreases the risk of sudden infant death syndrome (SIDS). Allow your baby to feed on each breast as long as he or she wants. When your baby unlatches or falls asleep while feeding from the first breast,  offer the second breast. Because newborns are often sleepy in the first few weeks of life, you may need to awaken your baby to get him or her to feed. Breastfeeding times will vary from baby to baby. However, the following rules can serve as a guide to help you make sure that your baby is properly fed:  Newborns (babies 35 weeks of age or younger) may breastfeed every 1-3 hours.  Newborns should not go without breastfeeding for longer than 3 hours during the day or 5 hours during the night.  You should breastfeed your baby a minimum of 8 times in a 24-hour period.  Breast milk pumping Pumping and storing breast milk allows you to make sure that your baby is exclusively fed your breast milk, even at times when you are unable to breastfeed. This is especially important if you go back to work while you are still breastfeeding, or if you are not able to be present during feedings. Your lactation consultant can help you find a method of pumping that works best for you and give you guidelines about how long it is safe to store breast milk. Caring for your breasts while you breastfeed Nipples can become dry, cracked, and sore while breastfeeding. The following recommendations can help keep your breasts moisturized and healthy:  Avoid using soap on your nipples.  Wear a supportive bra designed especially for nursing. Avoid wearing underwire-style bras or extremely tight bras (sports bras).  Air-dry your nipples for 3-4 minutes after each feeding.  Use only cotton bra pads to absorb leaked breast milk. Leaking of breast milk between feedings is normal.  Use lanolin on your nipples after breastfeeding. Lanolin helps to maintain your skin's normal moisture barrier. Pure lanolin is not harmful (not toxic) to your baby. You may also hand express a few drops of breast milk and gently massage that milk into your nipples and allow the milk to air-dry.  In the first few weeks after giving birth, some women  experience breast engorgement. Engorgement can make your breasts feel heavy, warm, and tender to the touch. Engorgement peaks within 3-5 days after you give birth. The following recommendations can help to ease engorgement:  Completely empty your breasts while breastfeeding or pumping. You may want to start by applying warm, moist heat (in the shower or with warm, water-soaked hand towels) just before feeding or pumping. This increases circulation and helps the milk flow. If your baby does not completely empty your breasts while breastfeeding, pump any extra milk after he or she is finished.  Apply ice packs to your breasts immediately after breastfeeding or pumping, unless this is too uncomfortable for you. To do this: ? Put ice in a plastic bag. ? Place a towel between your skin and the bag. ? Leave the ice on for 20 minutes, 2-3 times a day.  Make sure  that your baby is latched on and positioned properly while breastfeeding.  If engorgement persists after 48 hours of following these recommendations, contact your health care provider or a Advertising copywriter. Overall health care recommendations while breastfeeding  Eat 3 healthy meals and 3 snacks every day. Well-nourished mothers who are breastfeeding need an additional 450-500 calories a day. You can meet this requirement by increasing the amount of a balanced diet that you eat.  Drink enough water to keep your urine pale yellow or clear.  Rest often, relax, and continue to take your prenatal vitamins to prevent fatigue, stress, and low vitamin and mineral levels in your body (nutrient deficiencies).  Do not use any products that contain nicotine or tobacco, such as cigarettes and e-cigarettes. Your baby may be harmed by chemicals from cigarettes that pass into breast milk and exposure to secondhand smoke. If you need help quitting, ask your health care provider.  Avoid alcohol.  Do not use illegal drugs or marijuana.  Talk with your  health care provider before taking any medicines. These include over-the-counter and prescription medicines as well as vitamins and herbal supplements. Some medicines that may be harmful to your baby can pass through breast milk.  It is possible to become pregnant while breastfeeding. If birth control is desired, ask your health care provider about options that will be safe while breastfeeding your baby. Where to find more information: [employer]: www.llli.org Contact a health care provider if:  You feel like you want to stop breastfeeding or have become frustrated with breastfeeding.  Your nipples are cracked or bleeding.  Your breasts are red, tender, or warm.  You have: ? Painful breasts or nipples. ? A swollen area on either breast. ? A fever or chills. ? Nausea or vomiting. ? Drainage other than breast milk from your nipples.  Your breasts do not become full before feedings by the fifth day after you give birth.  You feel sad and depressed.  Your baby is: ? Too sleepy to eat well. ? Having trouble sleeping. ? More than 76 week old and wetting fewer than 6 diapers in a 24-hour period. ? Not gaining weight by 74 days of age.  Your baby has fewer than 3 stools in a 24-hour period.  Your baby's skin or the white parts of his or her eyes become yellow. Get help right away if:  Your baby is overly tired (lethargic) and does not want to wake up and feed.  Your baby develops an unexplained fever. Summary  Breastfeeding offers many health benefits for infant and mothers.  Try to breastfeed your infant when he or she shows early signs of hunger.  Gently tickle or stroke your baby's lips with your finger or nipple to allow the baby to open his or her mouth. Bring the baby to your breast. Make sure that much of the areola is in your baby's mouth. Offer one side and burp the baby before you offer the other side.  Talk with your health care provider or lactation  consultant if you have questions or you face problems as you breastfeed. This information is not intended to replace advice given to you by your health care provider. Make sure you discuss any questions you have with your health care provider. Document Released: 12/01/2005 Document Revised: 01/02/2017 Document Reviewed: 01/02/2017 Elsevier Interactive Patient Education  2018 [employer].  Diabetes Mellitus and Nutrition When you have diabetes (diabetes mellitus), it is very important to have healthy eating  habits because your blood sugar (glucose) levels are greatly affected by what you eat and drink. Eating healthy foods in the appropriate amounts, at about the same times every day, can help you:  Control your blood glucose.  Lower your risk of heart disease.  Improve your blood pressure.  Reach or maintain a healthy weight.  Every person with diabetes is different, and each person has different needs for a meal plan. Your health care provider may recommend that you work with a diet and nutrition specialist (dietitian) to make a meal plan that is best for you. Your meal plan may vary depending on factors such as:  The calories you need.  The medicines you take.  Your weight.  Your blood glucose, blood pressure, and cholesterol levels.  Your activity level.  Other health conditions you have, such as heart or kidney disease.  How do carbohydrates affect me? Carbohydrates affect your blood glucose level more than any other type of food. Eating carbohydrates naturally increases the amount of glucose in your blood. Carbohydrate counting is a method for keeping track of how many carbohydrates you eat. Counting carbohydrates is important to keep your blood glucose at a healthy level, especially if you use insulin or take certain oral diabetes medicines. It is important to know how many carbohydrates you can safely have in each meal. This is different for every person. Your dietitian can  help you calculate how many carbohydrates you should have at each meal and for snack. Foods that contain carbohydrates include:  Bread, cereal, rice, pasta, and crackers.  Potatoes and corn.  Peas, beans, and lentils.  Milk and yogurt.  Fruit and juice.  Desserts, such as cakes, cookies, ice cream, and candy.  How does alcohol affect me? Alcohol can cause a sudden decrease in blood glucose (hypoglycemia), especially if you use insulin or take certain oral diabetes medicines. Hypoglycemia can be a life-threatening condition. Symptoms of hypoglycemia (sleepiness, dizziness, and confusion) are similar to symptoms of having too much alcohol. If your health care provider says that alcohol is safe for you, follow these guidelines:  Limit alcohol intake to no more than 1 drink per day for nonpregnant women and 2 drinks per day for men. One drink equals 12 oz of beer, 5 oz of wine, or 1 oz of hard liquor.  Do not drink on an empty stomach.  Keep yourself hydrated with water, diet soda, or unsweetened iced tea.  Keep in mind that regular soda, juice, and other mixers may contain a lot of sugar and must be counted as carbohydrates.  What are tips for following this plan? Reading food labels  Start by checking the serving size on the label. The amount of calories, carbohydrates, fats, and other nutrients listed on the label are based on one serving of the food. Many foods contain more than one serving per package.  Check the total grams (g) of carbohydrates in one serving. You can calculate the number of servings of carbohydrates in one serving by dividing the total carbohydrates by 15. For example, if a food has 30 g of total carbohydrates, it would be equal to 2 servings of carbohydrates.  Check the number of grams (g) of saturated and trans fats in one serving. Choose foods that have low or no amount of these fats.  Check the number of milligrams (mg) of sodium in one serving. Most  people should limit total sodium intake to less than 2,300 mg per day.  Always check the nutrition  information of foods labeled as "low-fat" or "nonfat". These foods may be higher in added sugar or refined carbohydrates and should be avoided.  Talk to your dietitian to identify your daily goals for nutrients listed on the label. Shopping  Avoid buying canned, premade, or processed foods. These foods tend to be high in fat, sodium, and added sugar.  Shop around the outside edge of the grocery store. This includes fresh fruits and vegetables, bulk grains, fresh meats, and fresh dairy. Cooking  Use low-heat cooking methods, such as baking, instead of high-heat cooking methods like deep frying.  Cook using healthy oils, such as olive, canola, or sunflower oil.  Avoid cooking with butter, cream, or high-fat meats. Meal planning  Eat meals and snacks regularly, preferably at the same times every day. Avoid going long periods of time without eating.  Eat foods high in fiber, such as fresh fruits, vegetables, beans, and whole grains. Talk to your dietitian about how many servings of carbohydrates you can eat at each meal.  Eat 4-6 ounces of lean protein each day, such as lean meat, chicken, fish, eggs, or tofu. 1 ounce is equal to 1 ounce of meat, chicken, or fish, 1 egg, or 1/4 cup of tofu.  Eat some foods each day that contain healthy fats, such as avocado, nuts, seeds, and fish. Lifestyle   Check your blood glucose regularly.  Exercise at least 30 minutes 5 or more days each week, or as told by your health care provider.  Take medicines as told by your health care provider.  Do not use any products that contain nicotine or tobacco, such as cigarettes and e-cigarettes. If you need help quitting, ask your health care provider.  Work with a Veterinary surgeon or diabetes educator to identify strategies to manage stress and any emotional and social challenges. What are some questions to ask my  health care provider?  Do I need to meet with a diabetes educator?  Do I need to meet with a dietitian?  What number can I call if I have questions?  When are the best times to check my blood glucose? Where to find more information:  American Diabetes Association: diabetes.org/food-and-fitness/food  Academy of Nutrition and Dietetics: https://www.vargas.com/  [employer] of Diabetes and Digestive and Kidney Diseases (NIH): FindJewelers.cz Summary  A healthy meal plan will help you control your blood glucose and maintain a healthy lifestyle.  Working with a diet and nutrition specialist (dietitian) can help you make a meal plan that is best for you.  Keep in mind that carbohydrates and alcohol have immediate effects on your blood glucose levels. It is important to count carbohydrates and to use alcohol carefully. This information is not intended to replace advice given to you by your health care provider. Make sure you discuss any questions you have with your health care provider. Document Released: 08/28/2005 Document Revised: 01/05/2017 Document Reviewed: 01/05/2017 Elsevier Interactive Patient Education  [employer].

## 2018-07-15 NOTE — Progress Notes (Signed)
Subjective:  Nichole Watts is a 27 y.o. G3P2002 at 31w2dbeing seen today for ongoing prenatal care.  She is currently monitored for the following issues for this high-risk pregnancy and has Supervision of high risk pregnancy, antepartum; History of pregnancy induced hypertension; Gestational diabetes mellitus (GDM) in third trimester; and Limited prenatal care in third trimester on their problem list.  Patient reports no complaints.  Contractions: Irritability. Vag. Bleeding: None.  Movement: Present. Denies leaking of fluid.   The following portions of the patient's history were reviewed and updated as appropriate: allergies, current medications, past family history, past medical history, past social history, past surgical history and problem list. Problem list updated.  Objective:   Vitals:   07/15/18 1043 07/15/18 1047  BP: 140/90 138/83  Pulse: (!) 101 (!) 108  Weight: 234 lb 4.8 oz (106.3 kg)     Fetal Status: Fetal Heart Rate (bpm): 148   Movement: Present     General:  Alert, oriented and cooperative. Patient is in no acute distress.  Skin: Skin is warm and dry. No rash noted.   Cardiovascular: Normal heart rate noted  Respiratory: Normal respiratory effort, no problems with respiration noted  Abdomen: Soft, gravid, appropriate for gestational age. Pain/Pressure: Present     Pelvic:  Cervical exam performed        Extremities: Normal range of motion.  Edema: Trace  Mental Status: Normal mood and affect. Normal behavior. Normal judgment and thought content.   Urinalysis:      Assessment and Plan:  Pregnancy: G3P2002 at 385w2d1. Supervision of high risk pregnancy, antepartum Stable Labor precautions  2. Gestational diabetes mellitus (GDM) in third trimester, gestational diabetes method of control unspecified Did not seen Diabetic educator last week. Fasting CBG today 108 Pt provide Glucometer and provided instructions on how to obtained CBG's CBG goals  reviewed with pt Diet and exercise reviewed with pt  - [place] OB FOLLOW UP; Future - Hemoglobin A1c - POCT Glucose (CBG)  3. History of pregnancy induced hypertension  - CBC - Comp Met (CMET) - Protein / creatinine ratio, urine  4. Limited prenatal care in third trimester   Term labor symptoms and general obstetric precautions including but not limited to vaginal bleeding, contractions, leaking of fluid and fetal movement were reviewed in detail with the patient. Please refer to After Visit Summary for other counseling recommendations.  Return in about 1 week (around 07/22/2018) for OB visit.   ErChancy MilroyMD

## 2018-07-16 LAB — COMPREHENSIVE METABOLIC PANEL
ALT: 12 IU/L (ref 0–32)
AST: 14 IU/L (ref 0–40)
Albumin/Globulin Ratio: 1.1 — ABNORMAL LOW (ref 1.2–2.2)
Albumin: 3.2 g/dL — ABNORMAL LOW (ref 3.5–5.5)
Alkaline Phosphatase: 223 IU/L — ABNORMAL HIGH (ref 39–117)
BUN/Creatinine Ratio: 16 (ref 9–23)
BUN: 8 mg/dL (ref 6–20)
Bilirubin Total: 0.2 mg/dL (ref 0.0–1.2)
CALCIUM: 9.2 mg/dL (ref 8.7–10.2)
CHLORIDE: 104 mmol/L (ref 96–106)
CO2: 16 mmol/L — AB (ref 20–29)
CREATININE: 0.51 mg/dL — AB (ref 0.57–1.00)
GFR, EST AFRICAN AMERICAN: 152 mL/min/{1.73_m2} (ref >59)
GFR, EST NON AFRICAN AMERICAN: 132 mL/min/{1.73_m2} (ref >59)
GLUCOSE: 96 mg/dL (ref 65–99)
Globulin, Total: 3 g/dL (ref 1.5–4.5)
Potassium: 4.3 mmol/L (ref 3.5–5.2)
Sodium: 136 mmol/L (ref 134–144)
TOTAL PROTEIN: 6.2 g/dL (ref 6.0–8.5)

## 2018-07-16 LAB — CBC
HEMATOCRIT: 40.6 % (ref 34.0–46.6)
HEMOGLOBIN: 13.1 g/dL (ref 11.1–15.9)
MCH: 28 pg (ref 26.6–33.0)
MCHC: 32.3 g/dL (ref 31.5–35.7)
MCV: 87 fL (ref 79–97)
Platelets: 306 10*3/uL (ref 150–450)
RBC: 4.68 x10E6/uL (ref 3.77–5.28)
RDW: 13 % (ref 12.3–15.4)
WBC: 10.9 10*3/uL — ABNORMAL HIGH (ref 3.4–10.8)

## 2018-07-16 LAB — PROTEIN / CREATININE RATIO, URINE
Creatinine, Urine: 80.7 mg/dL
PROTEIN/CREAT RATIO: 264 mg/g{creat} — AB (ref 0–200)
Protein, Ur: 21.3 mg/dL

## 2018-07-16 LAB — HEMOGLOBIN A1C
ESTIMATED AVERAGE GLUCOSE: 120 mg/dL
HEMOGLOBIN A1C: 5.8 % — AB (ref 4.8–5.6)

## 2018-07-20 ENCOUNTER — Other Ambulatory Visit: Payer: Self-pay

## 2018-07-20 ENCOUNTER — Encounter (HOSPITAL_COMMUNITY): Payer: Self-pay

## 2018-07-20 ENCOUNTER — Ambulatory Visit (HOSPITAL_COMMUNITY)
Admission: RE | Admit: 2018-07-20 | Discharge: 2018-07-20 | Disposition: A | Discharge status: Home / Self Care | Payer: Self-pay | Source: Ambulatory Visit | Attending: Obstetrics and Gynecology | Admitting: Obstetrics and Gynecology

## 2018-07-20 ENCOUNTER — Encounter (HOSPITAL_COMMUNITY): Payer: Self-pay | Admitting: *Deleted

## 2018-07-20 ENCOUNTER — Inpatient Hospital Stay (HOSPITAL_COMMUNITY)
Admission: AD | Admit: 2018-07-20 | Discharge: 2018-07-22 | DRG: 807 | Disposition: A | Discharge status: Home / Self Care | Payer: Medicaid Other | Attending: Family Medicine | Admitting: Family Medicine

## 2018-07-20 ENCOUNTER — Other Ambulatory Visit: Payer: Self-pay | Admitting: Obstetrics and Gynecology

## 2018-07-20 DIAGNOSIS — O0933 Supervision of pregnancy with insufficient antenatal care, third trimester: Secondary | ICD-10-CM

## 2018-07-20 DIAGNOSIS — O4103X0 Oligohydramnios, third trimester, not applicable or unspecified: Secondary | ICD-10-CM | POA: Diagnosis present

## 2018-07-20 DIAGNOSIS — Z3490 Encounter for supervision of normal pregnancy, unspecified, unspecified trimester: Secondary | ICD-10-CM | POA: Diagnosis present

## 2018-07-20 DIAGNOSIS — Z87891 Personal history of nicotine dependence: Secondary | ICD-10-CM

## 2018-07-20 DIAGNOSIS — O24419 Gestational diabetes mellitus in pregnancy, unspecified control: Secondary | ICD-10-CM

## 2018-07-20 DIAGNOSIS — O99214 Obesity complicating childbirth: Secondary | ICD-10-CM | POA: Diagnosis present

## 2018-07-20 DIAGNOSIS — E669 Obesity, unspecified: Secondary | ICD-10-CM | POA: Diagnosis present

## 2018-07-20 DIAGNOSIS — O24420 Gestational diabetes mellitus in childbirth, diet controlled: Secondary | ICD-10-CM | POA: Diagnosis present

## 2018-07-20 DIAGNOSIS — O4100X0 Oligohydramnios, unspecified trimester, not applicable or unspecified: Secondary | ICD-10-CM

## 2018-07-20 DIAGNOSIS — O24429 Gestational diabetes mellitus in childbirth, unspecified control: Secondary | ICD-10-CM | POA: Diagnosis not present

## 2018-07-20 DIAGNOSIS — O133 Gestational [pregnancy-induced] hypertension without significant proteinuria, third trimester: Secondary | ICD-10-CM

## 2018-07-20 DIAGNOSIS — Z3A38 38 weeks gestation of pregnancy: Secondary | ICD-10-CM | POA: Diagnosis not present

## 2018-07-20 DIAGNOSIS — O0990 Supervision of high risk pregnancy, unspecified, unspecified trimester: Secondary | ICD-10-CM

## 2018-07-20 DIAGNOSIS — Z8759 Personal history of other complications of pregnancy, childbirth and the puerperium: Secondary | ICD-10-CM

## 2018-07-20 HISTORY — DX: Gestational diabetes mellitus in pregnancy, unspecified control: O24.419

## 2018-07-20 LAB — CBC
HEMATOCRIT: 37.7 % (ref 36.0–46.0)
Hemoglobin: 12.8 g/dL (ref 12.0–15.0)
MCH: 29.1 pg (ref 26.0–34.0)
MCHC: 34 g/dL (ref 30.0–36.0)
MCV: 85.7 fL (ref 78.0–100.0)
Platelets: 303 10*3/uL (ref 150–400)
RBC: 4.4 MIL/uL (ref 3.87–5.11)
RDW: 13.4 % (ref 11.5–15.5)
WBC: 12.5 10*3/uL — ABNORMAL HIGH (ref 4.0–10.5)

## 2018-07-20 LAB — TYPE AND SCREEN
ABO/RH(D): B POS
ANTIBODY SCREEN: NEGATIVE

## 2018-07-20 LAB — ABO/RH: ABO/RH(D): B POS

## 2018-07-20 LAB — GLUCOSE, CAPILLARY
GLUCOSE-CAPILLARY: 85 mg/dL (ref 70–99)
GLUCOSE-CAPILLARY: 103 mg/dL — AB (ref 70–99)

## 2018-07-20 MED ORDER — SODIUM CHLORIDE 0.9 % IV SOLN
INTRAVENOUS | Status: DC
  Filled 2018-07-20: qty 1

## 2018-07-20 MED ORDER — OXYTOCIN BOLUS FROM INFUSION
500.0000 mL | Freq: Once | INTRAVENOUS | Status: AC
  Administered 2018-07-21: 500 mL via INTRAVENOUS

## 2018-07-20 MED ORDER — ONDANSETRON HCL 4 MG/2ML IJ SOLN: 4.0000 mg | Freq: Four times a day (QID) | INTRAMUSCULAR | Status: DC | PRN

## 2018-07-20 MED ORDER — ACETAMINOPHEN 325 MG PO TABS: 650.0000 mg | ORAL_TABLET | ORAL | Status: DC | PRN

## 2018-07-20 MED ORDER — MISOPROSTOL 50MCG HALF TABLET
50.0000 ug | ORAL_TABLET | Freq: Once | ORAL | Status: AC
  Administered 2018-07-20: 50 ug via ORAL
  Filled 2018-07-20: qty 1

## 2018-07-20 MED ORDER — OXYCODONE-ACETAMINOPHEN 5-325 MG PO TABS: 2.0000 | ORAL_TABLET | ORAL | Status: DC | PRN

## 2018-07-20 MED ORDER — FENTANYL CITRATE (PF) 100 MCG/2ML IJ SOLN
50.0000 ug | INTRAMUSCULAR | Status: DC | PRN
  Administered 2018-07-21: 100 ug via INTRAVENOUS
  Filled 2018-07-20: qty 2

## 2018-07-20 MED ORDER — LIDOCAINE HCL (PF) 1 % IJ SOLN
30.0000 mL | INTRAMUSCULAR | Status: DC | PRN
  Filled 2018-07-20: qty 30

## 2018-07-20 MED ORDER — LACTATED RINGERS IV SOLN: 500.0000 mL | INTRAVENOUS | Status: DC | PRN

## 2018-07-20 MED ORDER — SOD CITRATE-CITRIC ACID 500-334 MG/5ML PO SOLN: 30.0000 mL | ORAL | Status: DC | PRN

## 2018-07-20 MED ORDER — OXYTOCIN 40 UNITS IN LACTATED RINGERS INFUSION - SIMPLE MED: 2.5000 [IU]/h | INTRAVENOUS | Status: DC

## 2018-07-20 MED ORDER — OXYCODONE-ACETAMINOPHEN 5-325 MG PO TABS: 1.0000 | ORAL_TABLET | ORAL | Status: DC | PRN

## 2018-07-20 MED ORDER — LACTATED RINGERS IV SOLN
INTRAVENOUS | Status: DC
  Administered 2018-07-20 (×2): via INTRAVENOUS

## 2018-07-20 NOTE — Progress Notes (Signed)
Foley bulb (cook catheter) inserted at 1850 without complication. Patient tolerated procedure well. Cervix 1.5/25/-3. Will also add cytotec PO for labor augmentation.

## 2018-07-20 NOTE — ED Notes (Signed)
Pt ambulated to admitting.  Report called to birthing suites per direct admit by Dr. Dorisann FramesShankar/Stinson [facility].

## 2018-07-20 NOTE — Anesthesia Pain Management Evaluation Note (Signed)
  CRNA Pain Management Visit Note  Patient: Nichole Watts, 27 y.o., female  "Hello I am a member of the anesthesia team at [facility]. We have an anesthesia team available at all times to provide care throughout the hospital, including epidural management and anesthesia for C-section. I don't know your plan for the delivery whether it a natural birth, water birth, IV sedation, nitrous supplementation, doula or epidural, but we want to meet your pain goals."   1.Was your pain managed to your expectations on prior hospitalizations?   Yes   2.What is your expectation for pain management during this hospitalization?     Epidural  3.How can we help you reach that goal?   Record the patient's initial score and the patient's pain goal.   Pain: 0  Pain Goal: 5 [facility] wants you to be able to say your pain was always managed very well.  [name],Nichole Watts 07/20/2018

## 2018-07-20 NOTE — Progress Notes (Addendum)
Patient ID: Ranelle Oysterataly Ibarra Rodriguez, female   DOB: [birth year], 27 y.o.   MRN: [id number]  Introduced self to patient.  She had no complaints. Stated she did not take medicine for GDM at home and sugars were 'in the 180s' but were normal here according to her. Foley bulb is in place.  Patient due for next cytotec dose at 0023.  Membranes intact.   CBG has been < 110 on previous 4 readings.   FHT: 140bpm.  Mod. Variability. Present accels. No decels.  Cat. I.   BP 139/90 (BP Location: Left Arm)   Pulse 90   Temp 98.5 F (36.9 C) (Oral)   Resp 17   Ht 5\' 8"  (1.727 m)   Wt 108.6 kg (239 lb 8 oz)   LMP 11/26/2017 (Approximate)   BMI 36.42 kg/m   Dilation: 1.5 Effacement (%): 20 Station: -3 Presentation: Vertex Exam by:: dell  I confirm that I have verified the information documented in the resident's note and that I have also personally reperformed the physical exam and all medical decision making activities.  Will recheck cervix prior to giving cytotec to make sure bulb is not expelled Aviva SignsWilliams, Marie L, CNM

## 2018-07-20 NOTE — H&P (Addendum)
Obstetrics Admission History & Physical   Nichole Watts is a 27 y.o. female G3P2002 at 5571w0d presenting for IOL 2/2 oligo and A1GDM. Presented to clinic today and oligo was noted on US. Presented as direct admit. Reports that her fasting levels are high and that diabetes is not well controlled.  Has not been checking her blood sugars Denies vaginal bleeding, vaginal discharge, dysuria, contractions, LOF. Endorses fetal movement.  Pregnancy has been complicated by new-dx oligo, A1GDM, hx Pre[facility]E, [facility]  [facility]  OB History    Gravida  3   Para  2   Term  2   Preterm      AB      Living  2     SAB      TAB      Ectopic      Multiple      Live Births  2          Past Medical History:  Diagnosis Date  . Gestational diabetes   . Hypertension    during last pregnancy, last few weeks   Past Surgical History:  Procedure Laterality Date  . NO PAST SURGERIES     Family History: family history includes Hypertension in her father; Stroke in her father. Social History:  reports that she has quit smoking. Her smoking use included cigarettes. She has never used smokeless tobacco. She reports that she has current or past drug history. Drug: Marijuana. She reports that she does not drink alcohol.     Maternal Diabetes: Yes:  Diabetes Type:  Diet controlled Genetic Screening: Normal Maternal Ultrasounds/Referrals: Normal hgb elect, no genetic screen Fetal Ultrasounds or other Referrals:  None Maternal Substance Abuse:  No Significant Maternal Medications:  None Significant Maternal Lab Results:  None Other Comments:  None  Review of Systems  Constitutional: Negative for chills and fever.  Eyes: Negative for blurred vision and double vision.  Respiratory: Negative for shortness of breath.   Cardiovascular: Negative for chest pain.  Gastrointestinal: Negative for abdominal pain.  Genitourinary: Negative for dysuria and  hematuria.  Neurological: Negative for headaches.   History Dilation: 1 Effacement (%): 20 Station: -3 Blood pressure (!) 141/81, pulse 90, temperature 98.3 F (36.8 C), temperature source Oral, resp. rate 18, height 5\' 8"  (1.727 m), weight 108.6 kg (239 lb 8 oz), last menstrual period 11/26/2017. Exam Physical Exam  Constitutional: She is oriented to person, place, and time. She appears well-developed and well-nourished. No distress.  HENT:  Head: Normocephalic and atraumatic.  Eyes: Pupils are equal, round, and reactive to light. EOM are normal. No scleral icterus.  Cardiovascular: Normal rate and regular rhythm. Exam reveals no friction rub.  No murmur heard. Respiratory: Effort normal and breath sounds normal. No respiratory distress. She has no wheezes.  GI: Soft. Bowel sounds are normal. There is no tenderness.  Genitourinary: Vagina normal and uterus normal.  Neurological: She is alert and oriented to person, place, and time.  Skin: Skin is warm and dry. She is not diaphoretic.     Cervical exam: 1/25/-3 at 1730   Prenatal labs: ABO, Rh: B/Positive/-- (05/20 0947) Antibody: Negative (05/20 0947) Rubella: 1.65 (05/20 0947) RPR: Non Reactive (06/17 0848)  HBsAg: Negative (05/20 0947)  HIV: Non Reactive (06/17 0848)  GBS: Negative (07/24 0000)   Last US 07/20/18: AFI 3.9 (MVP 2/46), EFW 82%, anterior placenta, BPP 8/8   Assessment/Plan: Glucose monitoring per labor protocol  Routine intrapartum care Hx  Pre-E, monitor BPs Method of infant feeding: breast  Candis Schatz 07/20/2018, 5:50 PM   OB FELLOW HISTORY AND PHYSICAL ATTESTATION  I have seen and examined this patient; I agree with above documentation in the resident's note.   FB now in place and cytotec x1 given.   Marcy Siren, D.O. OB Fellow  07/20/2018, 7:18 PM

## 2018-07-21 ENCOUNTER — Encounter (HOSPITAL_COMMUNITY): Payer: Self-pay | Admitting: *Deleted

## 2018-07-21 ENCOUNTER — Inpatient Hospital Stay (HOSPITAL_COMMUNITY): Payer: Medicaid Other | Admitting: Anesthesiology

## 2018-07-21 DIAGNOSIS — O4103X0 Oligohydramnios, third trimester, not applicable or unspecified: Secondary | ICD-10-CM

## 2018-07-21 DIAGNOSIS — Z3A38 38 weeks gestation of pregnancy: Secondary | ICD-10-CM

## 2018-07-21 DIAGNOSIS — O24429 Gestational diabetes mellitus in childbirth, unspecified control: Secondary | ICD-10-CM

## 2018-07-21 LAB — GLUCOSE, CAPILLARY
GLUCOSE-CAPILLARY: 85 mg/dL (ref 70–99)
Glucose-Capillary: 87 mg/dL (ref 70–99)

## 2018-07-21 LAB — RPR: RPR Ser Ql: NONREACTIVE

## 2018-07-21 MED ORDER — DIPHENHYDRAMINE HCL 25 MG PO CAPS: 25.0000 mg | ORAL_CAPSULE | Freq: Four times a day (QID) | ORAL | Status: DC | PRN

## 2018-07-21 MED ORDER — WITCH HAZEL-GLYCERIN EX PADS: 1.0000 "application " | MEDICATED_PAD | CUTANEOUS | Status: DC | PRN

## 2018-07-21 MED ORDER — FENTANYL 2.5 MCG/ML BUPIVACAINE 1/10 % EPIDURAL INFUSION (WH - ANES)
14.0000 mL/h | INTRAMUSCULAR | Status: DC | PRN
  Administered 2018-07-21: 14 mL/h via EPIDURAL
  Filled 2018-07-21: qty 100

## 2018-07-21 MED ORDER — ZOLPIDEM TARTRATE 5 MG PO TABS: 5.0000 mg | ORAL_TABLET | Freq: Every evening | ORAL | Status: DC | PRN

## 2018-07-21 MED ORDER — DIBUCAINE 1 % RE OINT: 1.0000 "application " | TOPICAL_OINTMENT | RECTAL | Status: DC | PRN

## 2018-07-21 MED ORDER — PHENYLEPHRINE 40 MCG/ML (10ML) SYRINGE FOR IV PUSH (FOR BLOOD PRESSURE SUPPORT)
80.0000 ug | PREFILLED_SYRINGE | INTRAVENOUS | Status: DC | PRN
  Filled 2018-07-21: qty 5

## 2018-07-21 MED ORDER — SENNOSIDES-DOCUSATE SODIUM 8.6-50 MG PO TABS
2.0000 | ORAL_TABLET | ORAL | Status: DC
  Administered 2018-07-21: 2 via ORAL
  Filled 2018-07-21: qty 2

## 2018-07-21 MED ORDER — ONDANSETRON HCL 4 MG PO TABS: 4.0000 mg | ORAL_TABLET | ORAL | Status: DC | PRN

## 2018-07-21 MED ORDER — LACTATED RINGERS IV SOLN: 500.0000 mL | Freq: Once | INTRAVENOUS | Status: DC

## 2018-07-21 MED ORDER — TERBUTALINE SULFATE 1 MG/ML IJ SOLN
0.2500 mg | Freq: Once | INTRAMUSCULAR | Status: DC | PRN
  Filled 2018-07-21: qty 1

## 2018-07-21 MED ORDER — ONDANSETRON HCL 4 MG/2ML IJ SOLN: 4.0000 mg | INTRAMUSCULAR | Status: DC | PRN

## 2018-07-21 MED ORDER — IBUPROFEN 600 MG PO TABS
600.0000 mg | ORAL_TABLET | Freq: Four times a day (QID) | ORAL | Status: DC
  Administered 2018-07-21 – 2018-07-22 (×5): 600 mg via ORAL
  Filled 2018-07-21 (×5): qty 1

## 2018-07-21 MED ORDER — OXYTOCIN 40 UNITS IN LACTATED RINGERS INFUSION - SIMPLE MED
1.0000 m[IU]/min | INTRAVENOUS | Status: DC
  Administered 2018-07-21: 2 m[IU]/min via INTRAVENOUS
  Filled 2018-07-21: qty 1000

## 2018-07-21 MED ORDER — SIMETHICONE 80 MG PO CHEW: 80.0000 mg | CHEWABLE_TABLET | ORAL | Status: DC | PRN

## 2018-07-21 MED ORDER — LIDOCAINE HCL (PF) 1 % IJ SOLN
INTRAMUSCULAR | Status: DC | PRN
  Administered 2018-07-21: 3 mL via EPIDURAL
  Administered 2018-07-21: 5 mL via EPIDURAL
  Administered 2018-07-21: 2 mL via EPIDURAL
  Administered 2018-07-21: 3 mL via EPIDURAL

## 2018-07-21 MED ORDER — BENZOCAINE-MENTHOL 20-0.5 % EX AERO: 1.0000 "application " | INHALATION_SPRAY | CUTANEOUS | Status: DC | PRN

## 2018-07-21 MED ORDER — PHENYLEPHRINE 40 MCG/ML (10ML) SYRINGE FOR IV PUSH (FOR BLOOD PRESSURE SUPPORT)
80.0000 ug | PREFILLED_SYRINGE | INTRAVENOUS | Status: DC | PRN
  Filled 2018-07-21: qty 5
  Filled 2018-07-21: qty 10

## 2018-07-21 MED ORDER — EPHEDRINE 5 MG/ML INJ
10.0000 mg | INTRAVENOUS | Status: DC | PRN
  Filled 2018-07-21: qty 2

## 2018-07-21 MED ORDER — COCONUT OIL OIL: 1.0000 "application " | TOPICAL_OIL | Status: DC | PRN

## 2018-07-21 MED ORDER — TETANUS-DIPHTH-ACELL PERTUSSIS 5-2.5-18.5 LF-MCG/0.5 IM SUSP: 0.5000 mL | Freq: Once | INTRAMUSCULAR | Status: DC

## 2018-07-21 MED ORDER — ACETAMINOPHEN 325 MG PO TABS: 650.0000 mg | ORAL_TABLET | ORAL | Status: DC | PRN

## 2018-07-21 MED ORDER — PRENATAL MULTIVITAMIN CH
1.0000 | ORAL_TABLET | Freq: Every day | ORAL | Status: DC
  Administered 2018-07-21 – 2018-07-22 (×2): 1 via ORAL
  Filled 2018-07-21 (×2): qty 1

## 2018-07-21 MED ORDER — DIPHENHYDRAMINE HCL 50 MG/ML IJ SOLN: 12.5000 mg | INTRAMUSCULAR | Status: DC | PRN

## 2018-07-21 NOTE — Anesthesia Procedure Notes (Signed)
Epidural Patient location during procedure: OB Start time: 07/21/2018 7:30 AM End time: 07/21/2018 7:36 AM  Staffing Anesthesiologist: Cecile Hearingurk, Stephen Edward, MD Performed: anesthesiologist   Preanesthetic Checklist Completed: patient identified, pre-op evaluation, timeout performed, IV checked, risks and benefits discussed and monitors and equipment checked  Epidural Patient position: sitting Prep: DuraPrep Patient monitoring: blood pressure and continuous pulse ox Approach: midline Location: L3-L4 Injection technique: LOR air  Needle:  Needle type: Tuohy  Needle gauge: 17 G Needle length: 9 cm Needle insertion depth: 6 cm Catheter size: 19 Gauge Catheter at skin depth: 12 cm Test dose: negative and Other (1% Lidocaine)  Additional Notes Patient identified.  Risk benefits discussed including failed block, incomplete pain control, headache, nerve damage, paralysis, blood pressure changes, nausea, vomiting, reactions to medication both toxic or allergic, and postpartum back pain.  Patient expressed understanding and wished to proceed.  All questions were answered.  Sterile technique used throughout procedure and epidural site dressed with sterile barrier dressing. No paresthesia or other complications noted. The patient did not experience any signs of intravascular injection such as tinnitus or metallic taste in mouth nor signs of intrathecal spread such as rapid motor block. Please see nursing notes for vital signs. Reason for block:procedure for pain

## 2018-07-21 NOTE — Progress Notes (Signed)
Patient ID: Nichole Watts, female   DOB: Dec 26, 1990, 27 y.o.   MRN: [id number]  Checked on patient, no complaints.  Performed cervical exam, patient still 5cm and  50% effaced.  Decided not to AROM at this time.  Started Pitocin.  GBS neg. S/p FB, cytotec.  FHT: 130 bpm. Mod. Var. accels present. No decels.   BP (!) 145/95   Pulse 81   Temp 98.2 F (36.8 C) (Oral)   Resp 18   Ht 5\' 8"  (1.727 m)   Wt 108.6 kg (239 lb 8 oz)   LMP 11/26/2017 (Approximate)   BMI 36.42 kg/m   Dilation: 5 Effacement (%): 50 Station: -3 Presentation: Vertex Exam by:: Larrie KassS Willis RN

## 2018-07-21 NOTE — Anesthesia Postprocedure Evaluation (Signed)
Anesthesia Post Note  Patient: Nichole Watts  Procedure(s) Performed: AN AD HOC LABOR EPIDURAL     Patient location during evaluation: Mother Baby Anesthesia Type: Epidural Level of consciousness: awake and alert Pain management: pain level controlled Vital Signs Assessment: post-procedure vital signs reviewed and stable Respiratory status: spontaneous breathing, nonlabored ventilation and respiratory function stable Cardiovascular status: stable Postop Assessment: no headache, no backache, epidural receding, able to ambulate, adequate PO intake, no apparent nausea or vomiting and patient able to bend at knees Anesthetic complications: no    Last Vitals:  Vitals:   07/21/18 1007 07/21/18 1127  BP: 132/80 136/86  Pulse: 79 76  Resp: 18 18  Temp: 36.9 C 36.7 C  SpO2: 99%     Last Pain:  Vitals:   07/21/18 1135  TempSrc:   PainSc: 0-No pain   Pain Goal: Patients Stated Pain Goal: 5 (07/20/18 1723)               Nichole Watts,Nichole Watts

## 2018-07-21 NOTE — Anesthesia Preprocedure Evaluation (Addendum)
Anesthesia Evaluation  Patient identified by MRN, date of birth, ID band Patient awake    Reviewed: Allergy & Precautions, NPO status , Patient's Chart, lab work & pertinent test results  Airway Mallampati: II  TM Distance: >3 FB Neck ROM: Full    Dental  (+) Teeth Intact, Dental Advisory Given   Pulmonary former smoker,    Pulmonary exam normal breath sounds clear to auscultation       Cardiovascular hypertension, negative cardio ROS Normal cardiovascular exam Rhythm:Regular Rate:Normal     Neuro/Psych negative neurological ROS     GI/Hepatic negative GI ROS, Neg liver ROS,   Endo/Other  diabetes, Well Controlled, GestationalObesity   Renal/GU negative Renal ROS     Musculoskeletal negative musculoskeletal ROS (+)   Abdominal   Peds  Hematology negative hematology ROS (+) Plt 303k   Anesthesia Other Findings Day of surgery medications reviewed with the patient.  Reproductive/Obstetrics (+) Pregnancy                            Anesthesia Physical Anesthesia Plan  ASA: II  Anesthesia Plan: Epidural   Post-op Pain Management:    Induction:   PONV Risk Score and Plan: 2 and Treatment may vary due to age or medical condition  Airway Management Planned: Natural Airway  Additional Equipment:   Intra-op Plan:   Post-operative Plan:   Informed Consent: I have reviewed the patients History and Physical, chart, labs and discussed the procedure including the risks, benefits and alternatives for the proposed anesthesia with the patient or authorized representative who has indicated his/her understanding and acceptance.   Dental advisory given  Plan Discussed with:   Anesthesia Plan Comments: (Patient identified. Risks/Benefits/Options discussed with patient including but not limited to bleeding, infection, nerve damage, paralysis, failed block, incomplete pain control, headache,  blood pressure changes, nausea, vomiting, reactions to medication both or allergic, itching and postpartum back pain. Confirmed with bedside nurse the patient's most recent platelet count. Confirmed with patient that they are not currently taking any anticoagulation, have any bleeding history or any family history of bleeding disorders. Patient expressed understanding and wished to proceed. All questions were answered. )        Anesthesia Quick Evaluation

## 2018-07-22 ENCOUNTER — Encounter: Payer: Self-pay | Admitting: Student

## 2018-07-22 LAB — GLUCOSE, CAPILLARY: GLUCOSE-CAPILLARY: 118 mg/dL — AB (ref 70–99)

## 2018-07-22 MED ORDER — IBUPROFEN 600 MG PO TABS: 600.0000 mg | ORAL_TABLET | Freq: Four times a day (QID) | ORAL | 0 refills | Status: DC

## 2018-07-22 MED ORDER — SENNOSIDES-DOCUSATE SODIUM 8.6-50 MG PO TABS: 2.0000 | ORAL_TABLET | ORAL | 0 refills | Status: DC

## 2018-07-22 NOTE — Lactation Note (Signed)
This note was copied from a baby's chart. Lactation Consultation Note; Mother is a P3 . She reports that she breastfed her other two children for 2-4 months. Mother reports that her nipples are slightly sore.  Offered assistance with latching infant . Infant placed in cross cradle hold, with good pillow support.  Infant latched and sustained latch for 15 mins. Observed frequent suckling and audible swallows.  Mother reports that latch felt so much better.   Teaching on proper positioning and prevention of sore nipples.  Mother was given a harmony hand pump with instructions to use as needed. #27 flange is a good fit.   Lactation brochure given to mother and basic teaching done. Mother is able to hand express large drops of colostrum. Advised mother to continue to cue base feed and feed infant at least 8-12 times in 24 hours. Discussed cluster feeding.  Mother is active with WIC in [place]. Mother is aware of available LC services and community support.     Patient Name: Nichole Watts'ZToday's Date: 07/22/2018 Reason for consult: Initial assessment   Maternal Data Has patient been taught Hand Expression?: Yes Does the patient have breastfeeding experience prior to this delivery?: Yes  Feeding Feeding Type: Breast Fed Length of feed: 15 min  LATCH Score Latch: Grasps breast easily, tongue down, lips flanged, rhythmical sucking.  Audible Swallowing: Spontaneous and intermittent  Type of Nipple: Everted at rest and after stimulation  Comfort (Breast/Nipple): Soft / non-tender  Hold (Positioning): Assistance needed to correctly position infant at breast and maintain latch.  LATCH Score: 9  Interventions Interventions: Breast feeding basics reviewed;Assisted with latch;Skin to skin;Hand express;Breast compression;Support pillows;Position options;Expressed milk;Hand pump  Lactation Tools Discussed/Used     Consult Status Consult Status:  Complete    Michel BickersKendrick, Nichlas Pitera McCoy 07/22/2018, 12:27 PM

## 2018-07-22 NOTE — Discharge Instructions (Signed)
Postpartum Care After Vaginal Delivery °The period of time right after you deliver your newborn is called the postpartum period. °What kind of medical care will I receive? °· You may continue to receive fluids and medicines through an IV tube inserted into one of your veins. °· If an incision was made near your vagina (episiotomy) or if you had some vaginal tearing during delivery, cold compresses may be placed on your episiotomy or your tear. This helps to reduce pain and swelling. °· You may be given a squirt bottle to use when you go to the bathroom. You may use this until you are comfortable wiping as usual. To use the squirt bottle, follow these steps: °? Before you urinate, fill the squirt bottle with warm water. Do not use hot water. °? After you urinate, while you are sitting on the toilet, use the squirt bottle to rinse the area around your urethra and vaginal opening. This rinses away any urine and blood. °? You may do this instead of wiping. As you start healing, you may use the squirt bottle before wiping yourself. Make sure to wipe gently. °? Fill the squirt bottle with clean water every time you use the bathroom. °· You will be given sanitary pads to wear. °How can I expect to feel? °· You may not feel the need to urinate for several hours after delivery. °· You will have some soreness and pain in your abdomen and vagina. °· If you are breastfeeding, you may have uterine contractions every time you breastfeed for up to several weeks postpartum. Uterine contractions help your uterus return to its normal size. °· It is normal to have vaginal bleeding (lochia) after delivery. The amount and appearance of lochia is often similar to a menstrual period in the first week after delivery. It will gradually decrease over the next few weeks to a dry, yellow-brown discharge. For most women, lochia stops completely by 6-8 weeks after delivery. Vaginal bleeding can vary from woman to woman. °· Within the first few  days after delivery, you may have breast engorgement. This is when your breasts feel heavy, full, and uncomfortable. Your breasts may also throb and feel hard, tightly stretched, warm, and tender. After this occurs, you may have milk leaking from your breasts. Your health care provider can help you relieve discomfort due to breast engorgement. Breast engorgement should go away within a few days. °· You may feel more sad or worried than normal due to hormonal changes after delivery. These feelings should not last more than a few days. If these feelings do not go away after several days, speak with your health care provider. °How should I care for myself? °· Tell your health care provider if you have pain or discomfort. °· Drink enough water to keep your urine clear or pale yellow. °· Wash your hands thoroughly with soap and water for at least 20 seconds after changing your sanitary pads, after using the toilet, and before holding or feeding your baby. °· If you are not breastfeeding, avoid touching your breasts a lot. Doing this can make your breasts produce more milk. °· If you become weak or lightheaded, or you feel like you might faint, ask for help before: °? Getting out of bed. °? Showering. °· Change your sanitary pads frequently. Watch for any changes in your flow, such as a sudden increase in volume, a change in color, the passing of large blood clots. If you pass a blood clot from your vagina, save it   to show to your health care provider. Do not flush blood clots down the toilet without having your health care provider look at them. °· Make sure that all your vaccinations are up to date. This can help protect you and your baby from getting certain diseases. You may need to have immunizations done before you leave the hospital. °· If desired, talk with your health care provider about methods of family planning or birth control (contraception). °How can I start bonding with my baby? °Spending as much time as  possible with your baby is very important. During this time, you and your baby can get to know each other and develop a bond. Having your baby stay with you in your room (rooming in) can give you time to get to know your baby. Rooming in can also help you become comfortable caring for your baby. Breastfeeding can also help you bond with your baby. °How can I plan for returning home with my baby? °· Make sure that you have a car seat installed in your vehicle. °? Your car seat should be checked by a certified car seat installer to make sure that it is installed safely. °? Make sure that your baby fits into the car seat safely. °· Ask your health care provider any questions you have about caring for yourself or your baby. Make sure that you are able to contact your health care provider with any questions after leaving the hospital. °This information is not intended to replace advice given to you by your health care provider. Make sure you discuss any questions you have with your health care provider. °Document Released: 09/28/2007 Document Revised: 05/05/2016 Document Reviewed: 11/05/2015 °Elsevier Interactive Patient Education © 2018 [employer] ° °

## 2018-07-22 NOTE — Discharge Summary (Signed)
OB Discharge Summary     Patient Name: Nichole Watts DOB: 1991-03-20 MRN: [id number]  Date of admission: 07/20/2018 Delivering MD: Aviva SignsWILLIAMS, MARIE L   Date of discharge: 07/22/2018  Admitting diagnosis: 2138 WKS,  Intrauterine pregnancy: 4428w1d     Secondary diagnosis:  Active Problems:   Supervision of high risk pregnancy, antepartum   History of pregnancy induced hypertension   Gestational diabetes mellitus (GDM) in third trimester   Limited prenatal care in third trimester   Encounter for planned induction of labor   Oligohydramnios   SVD (spontaneous vaginal delivery)      Discharge diagnosis: Term Pregnancy Delivered                                                                                                Post partum procedures:None  Augmentation: Pitocin, Cytotec and Foley Balloon  Complications: None  Hospital course:  Induction of Labor With Vaginal Delivery   27 y.o. yo G3P3003 at 1728w1d was admitted to the hospital 07/20/2018 for induction of labor.  Indication for induction: Oligohydramnios.  Patient had an uncomplicated labor course as follows: Membrane Rupture Time/Date: 7:52 AM ,07/21/2018   Intrapartum Procedures: Episiotomy: None [1]                                         Lacerations:  2nd degree [3];Perineal [11]  Patient had delivery of a Viable infant.  Information for the patient's newborn:  Antony Hastebarra Watts, Girl Abcde [[id number]][[id number]]  Delivery Method: Vaginal, Spontaneous(Filed from Delivery Summary)   07/21/2018  Details of delivery can be found in separate delivery note.  Patient had a routine postpartum course. Patient is discharged home 07/22/18.  Physical exam  Vitals:   07/21/18 1524 07/21/18 1917 07/21/18 2322 07/22/18 0516  BP: 128/80 130/88 128/86 127/79  Pulse: 81 91 88 91  Resp: 18 18 17 16   Temp: 98.6 F (37 C) 97.9 F (36.6 C) 97.8 F (36.6 C) 98.3 F (36.8 C)  TempSrc: Oral Oral Oral Oral  SpO2:      Weight:      Height:        General: alert, cooperative and no distress Lochia: appropriate Uterine Fundus: firm Incision: N/A DVT Evaluation: No evidence of DVT seen on physical exam. Labs: Lab Results  Component Value Date   WBC 12.5 (H) 07/20/2018   HGB 12.8 07/20/2018   HCT 37.7 07/20/2018   MCV 85.7 07/20/2018   PLT 303 07/20/2018   CMP Latest Ref Rng & Units 07/15/2018  Glucose 65 - 99 mg/dL 96  BUN 6 - 20 mg/dL 8  Creatinine 4.400.57 - 1.021.00 mg/dL 7.25(D0.51(L)  Sodium 664134 - 403144 mmol/L 136  Potassium 3.5 - 5.2 mmol/L 4.3  Chloride 96 - 106 mmol/L 104  CO2 20 - 29 mmol/L 16(L)  Calcium 8.7 - 10.2 mg/dL 9.2  Total Protein 6.0 - 8.5 g/dL 6.2  Total Bilirubin 0.0 - 1.2 mg/dL 0.2  Alkaline Phos 39 - 117 IU/L 223(H)  AST 0 -  40 IU/L 14  ALT 0 - 32 IU/L 12    Discharge instruction: per After Visit Summary and "Baby and Me Booklet".  After visit meds:  Allergies as of 07/22/2018   No Known Allergies     Medication List    TAKE these medications   ibuprofen 600 MG tablet Commonly known as:  ADVIL,MOTRIN Take 1 tablet (600 mg total) by mouth every 6 (six) hours.   PRENATAL VITAMINS PO Take 1 tablet by mouth daily.   senna-docusate 8.6-50 MG tablet Commonly known as:  Senokot-S Take 2 tablets by mouth daily. Start taking on:  07/23/2018       Diet: routine diet  Activity: Advance as tolerated. Pelvic rest for 6 weeks.   Outpatient follow up:4 weeks Follow up Appt:No future appointments. Follow up Visit:No follow-ups on file.  Postpartum contraception: IUD Mirena  Newborn Data: Live born female  Birth Weight: 6 lb 9.8 oz (3000 g) APGAR: 8, 9  Newborn Delivery   Birth date/time:  07/21/2018 07:52:00 Delivery type:  Vaginal, Spontaneous     Baby Feeding: Breast Disposition:home with mother   07/22/2018 De Hollingshead, DO

## 2018-07-22 NOTE — Progress Notes (Signed)
Post Partum Day 1 Subjective: no complaints, up ad lib, voiding, tolerating PO and + flatus. She denies RUQ pain, swelling, dyspnea, dizziness, headache, vision changes, or peripheral edema. She states that she would like to go home ASAP today.   Objective: Blood pressure 127/79, pulse 91, temperature 98.3 F (36.8 C), temperature source Oral, resp. rate 16, height 5\' 8"  (1.727 m), weight 108.6 kg, last menstrual period 11/26/2017, SpO2 99 %, unknown if currently breastfeeding.  Physical Exam:  General: alert and cooperative Lochia: appropriate Uterine Fundus: firm DVT Evaluation: No cords or calf tenderness. No significant calf/ankle edema.  Recent Labs    07/20/18 1813  HGB 12.8  HCT 37.7    Assessment/Plan: Discharge home, [facility]   LOS: 2 days   Margarita RanaHarrison D Adrieanna Boteler 07/22/2018, 7:43 AM

## 2018-07-29 ENCOUNTER — Encounter: Payer: Self-pay | Admitting: Advanced Practice Midwife

## 2018-08-31 ENCOUNTER — Other Ambulatory Visit: Payer: Self-pay | Admitting: General Practice

## 2018-08-31 ENCOUNTER — Ambulatory Visit (INDEPENDENT_AMBULATORY_CARE_PROVIDER_SITE_OTHER): Payer: Self-pay | Admitting: Student

## 2018-08-31 ENCOUNTER — Other Ambulatory Visit: Payer: Self-pay

## 2018-08-31 ENCOUNTER — Encounter: Payer: Self-pay | Admitting: Student

## 2018-08-31 DIAGNOSIS — Z3202 Encounter for pregnancy test, result negative: Secondary | ICD-10-CM

## 2018-08-31 DIAGNOSIS — O24419 Gestational diabetes mellitus in pregnancy, unspecified control: Secondary | ICD-10-CM

## 2018-08-31 LAB — POCT PREGNANCY, URINE: Preg Test, Ur: NEGATIVE

## 2018-08-31 NOTE — Progress Notes (Signed)
Patient left without finishing her glucose tolerance test; will send message to [facility] to reschedule it.

## 2018-08-31 NOTE — Progress Notes (Addendum)
Subjective:     Nichole Watts is a 27 y.o. female who presents for a postpartum visit. She is 6 weeks postpartum following a spontaneous vaginal delivery. I have fully reviewed the prenatal and intrapartum course. The delivery was at 6141w1d gestational weeks. Outcome: spontaneous vaginal delivery. Anesthesia: epidural. Postpartum course has been unremarkable. Baby's course has been unremarkable. Baby is feeding by bottle Rush Barer- Gerber. Bleeding no bleeding. Bowel function is normal. Bladder function is normal. Patient is sexually active but she has been using condoms. She is very firm that she is not pregnant and she really wants her IUD placed today.  Her last intercourse was around 9/9. Contraception method is IUD. Postpartum depression screening: negative.  The following portions of the patient's history were reviewed and updated as appropriate: allergies, current medications, past family history, past medical history, past social history, past surgical history and problem list.  Review of Systems Pertinent items are noted in HPI.   Objective:    BP 126/78   Pulse 63   Wt 220 lb (99.8 kg)   BMI 33.45 kg/m   General:  alert, cooperative and no distress   Breasts:  inspection negative, no nipple discharge or bleeding, no masses or nodularity palpable  Lungs: clear to auscultation bilaterally  Heart:  regular rate and rhythm, S1, S2 normal, no murmur, click, rub or gallop  Abdomen: soft, non-tender; bowel sounds normal; no masses,  no organomegaly   Vulva:  normal  Vagina: normal vagina; laceration well-healed  Cervix:  absent and not examined  Corpus: not examined  Adnexa:  not evaluated  Rectal Exam: Not performed.        Assessment:    Health postpartum exam. [employer] paient so and IUD placement and Pap smear not done at today's visit.  -Preg test negative today -BP negative today, not on any medications.  -Doing 2 hour GTT today; in process.  Plan:    1. Contraception:  IUD . IUD will be placed at [facility] [address].  Educated patient that she should continue to use contraception before she gets her IUD.  2. Plan to attend [facility] Pap clinic on 09-27-2018 3. Follow up in: 1 year or as needed.

## 2018-08-31 NOTE — Patient Instructions (Addendum)
-  Plan to attend free Pap Clinic on 09-27-2018 -Visit [facility] for IUD placement; continue to use condoms correctly with each sexual

## 2018-09-02 LAB — GLUCOSE TOLERANCE, 2 HOURS: Glucose, GTT - Fasting: 93 mg/dL (ref 65–99)

## 2018-09-03 ENCOUNTER — Other Ambulatory Visit: Payer: Self-pay

## 2019-09-09 IMAGING — US US MFM FETAL BPP W/O NON-STRESS
1 series · 13 of 28 positions shown · non-contrast
Comparison: none

[Series 1: us mfm fetal bpp w/o non-stress · 13 of 79 slices shown]
[im 3/79]
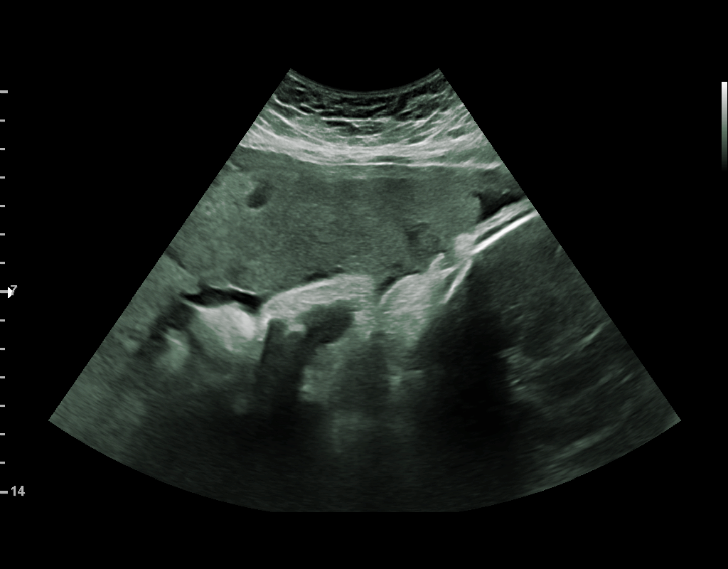
[im 9/79]
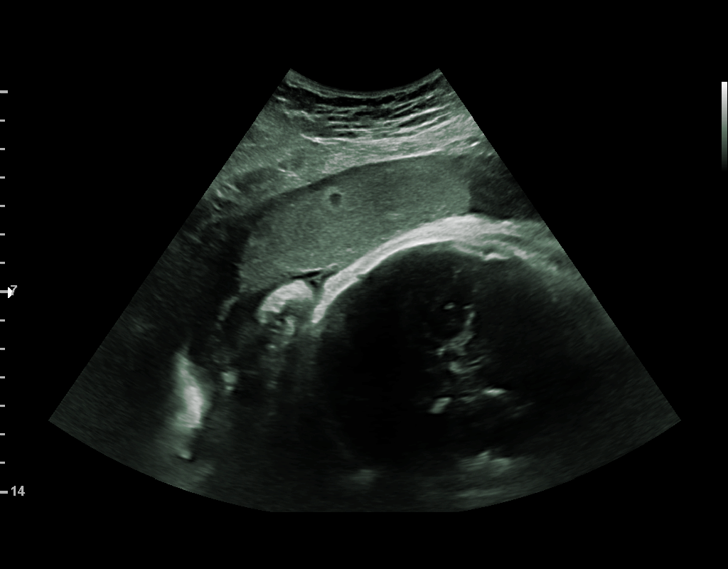
[im 15/79]
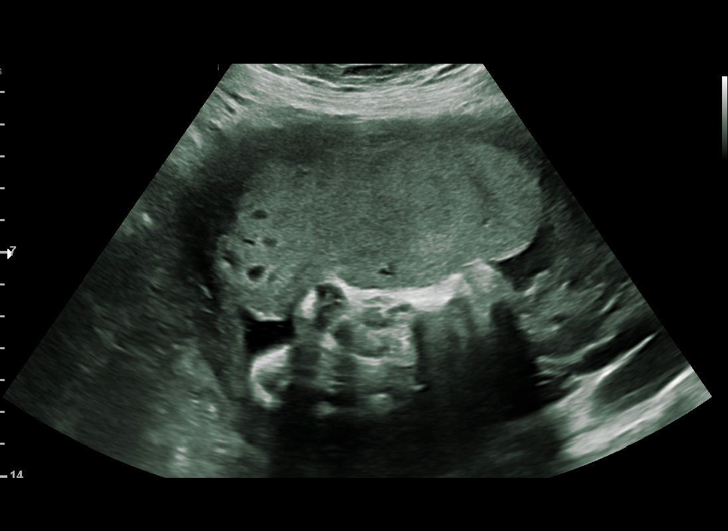
[im 21/79]
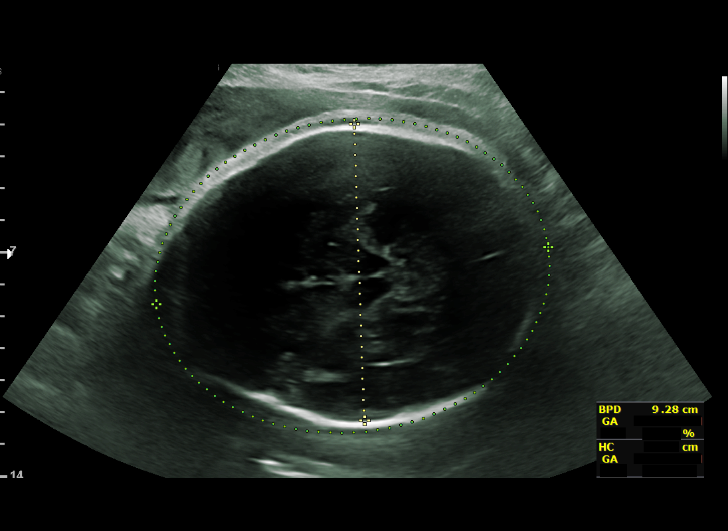
[im 27/79]
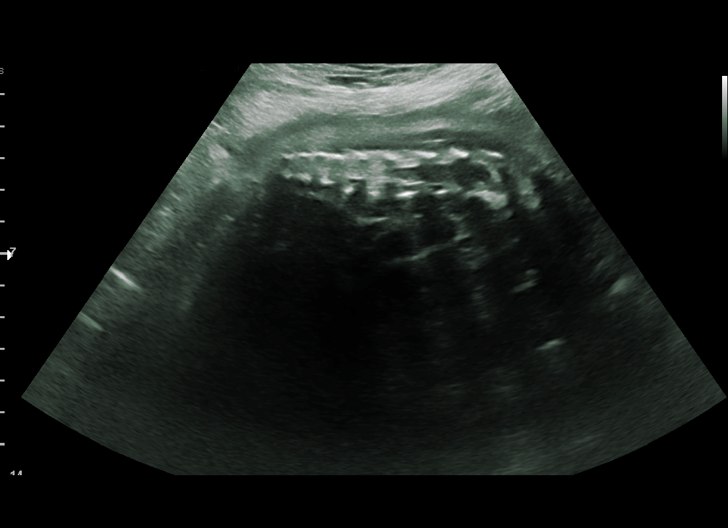
[im 32/79]
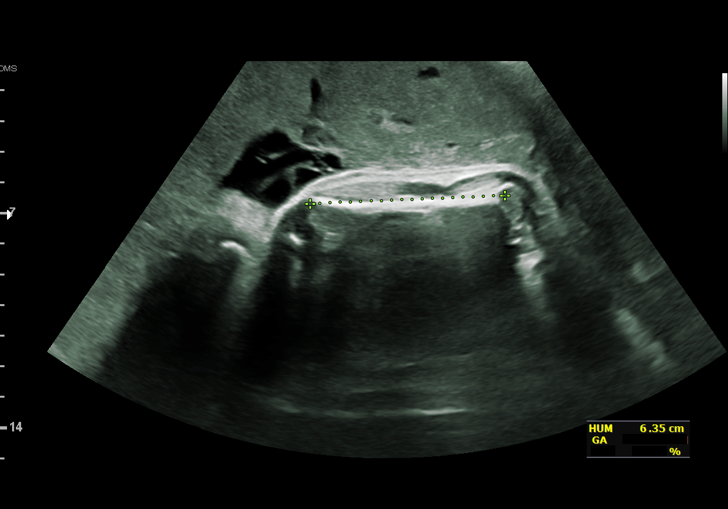
[im 41/79]
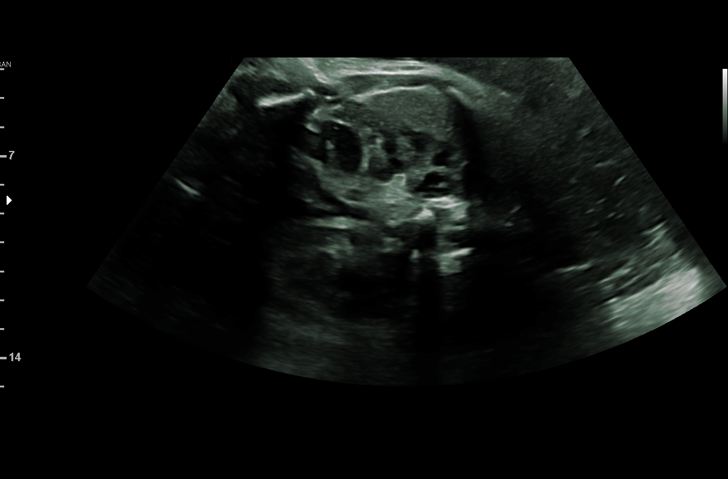
[im 47/79]
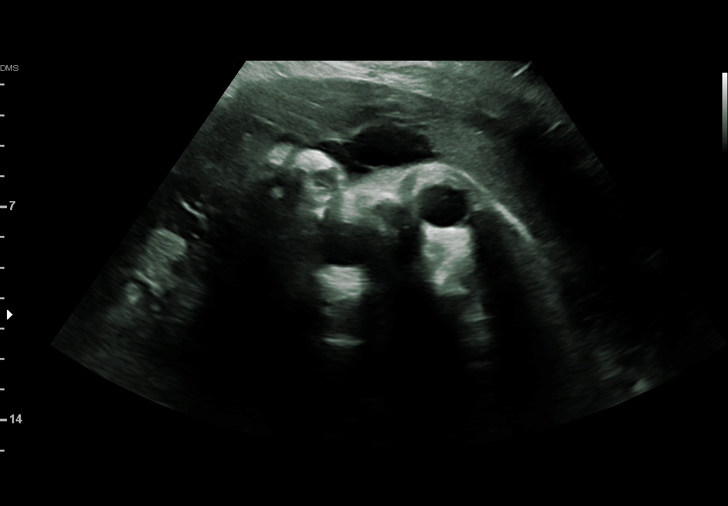
[im 53/79]
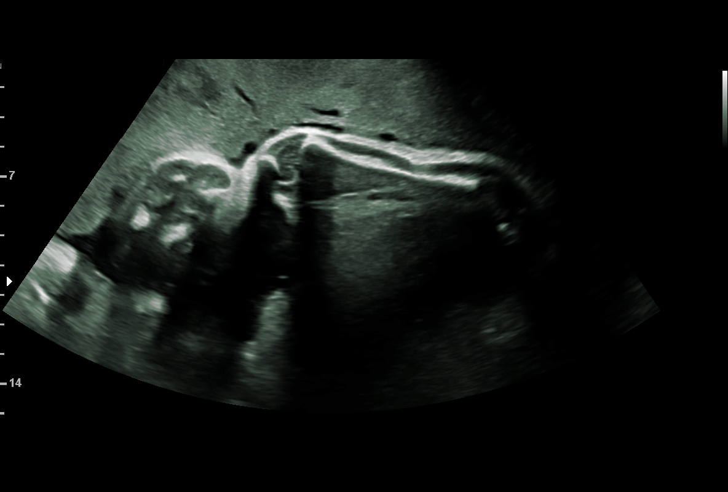
[im 58/79]
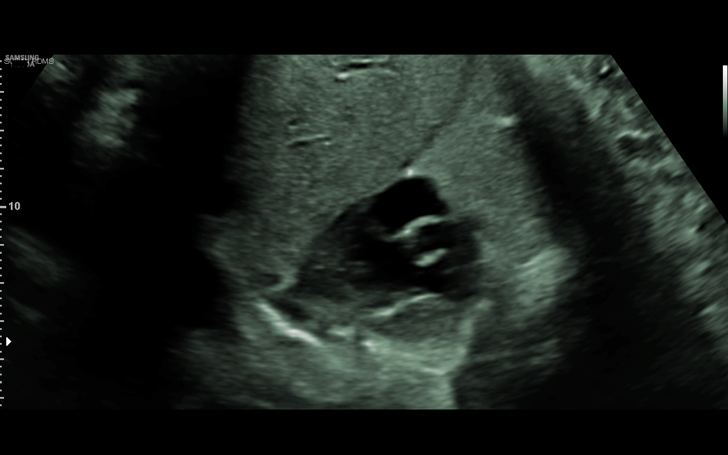
[im 64/79]
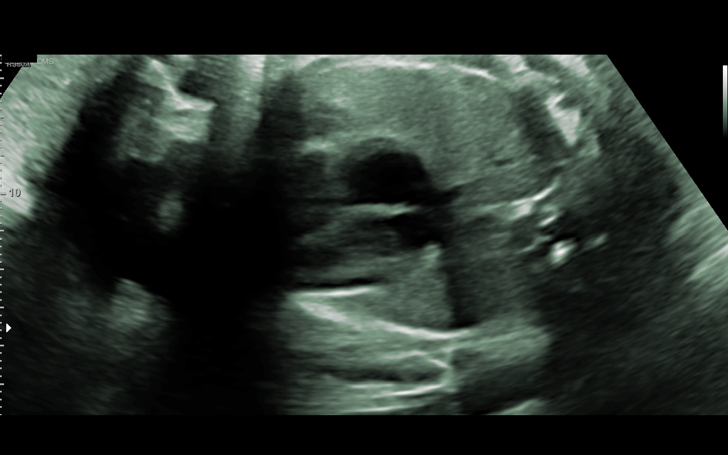
[im 70/79]
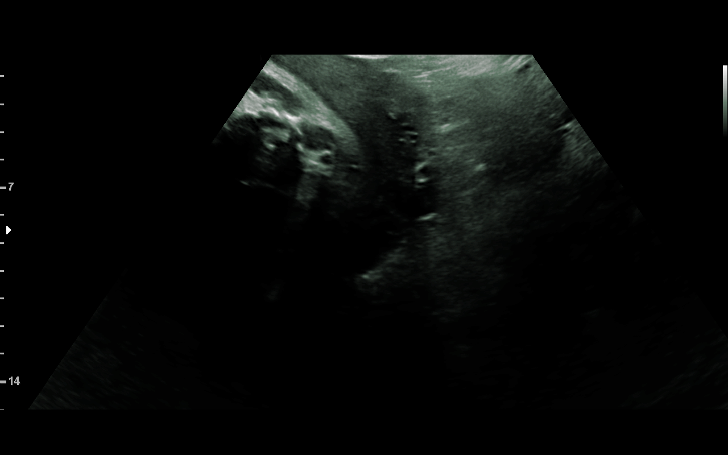
[im 76/79]
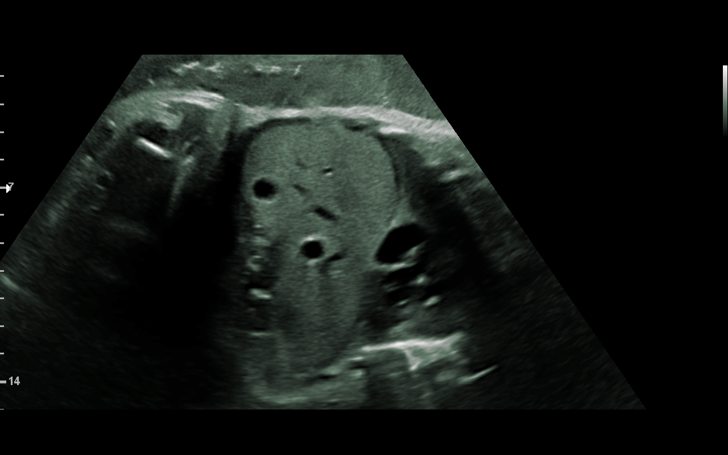

[13 of 28 positions shown; findings below may reference images not displayed]

RODRIGUEZ


1  BURUIAN URIEN            [id number]      [id number]     [id number]
2  BURUIAN URIEN            [id number]      [id number]     [id number]
Indications

38 weeks gestation of pregnancy
Gestational diabetes in pregnancy, diet
controlled
Insufficient Prenatal Care
Obesity complicating pregnancy, third
trimester
Poor obstetric history: Previous
preeclampsia / eclampsia/gestational HTN
Hypertension - Gestational
OB History

Gravidity:    3         Term:   2
Living:       2
Fetal Evaluation

Num Of Fetuses:     1
Fetal Heart         176
Rate(bpm):
Cardiac Activity:   Observed
Presentation:       Cephalic
Placenta:           Anterior
P. Cord Insertion:  Not well visualized

Amniotic Fluid
AFI FV:      Oligohydramnios

AFI Sum(cm)     %Tile       Largest Pocket(cm)
3.92            < 3
RUQ(cm)                     LUQ(cm)
2.46
Biophysical Evaluation

Amniotic F.V:   Within normal limits       F. Tone:        Observed
F. Movement:    Observed                   Score:          [DATE]
F. Breathing:   Observed
Biometry

BPD:      93.3  mm     G. Age:  38w 0d         72  %    CI:        74.27   %    70 - 86
FL/HC:      20.8   %    20.9 -
HC:      343.7  mm     G. Age:  39w 5d         71  %    HC/AC:      0.98        0.92 -
AC:      349.3  mm     G. Age:  38w 6d         85  %    FL/BPD:     76.5   %    71 - 87
FL:       71.4  mm     G. Age:  36w 4d         20  %    FL/AC:      20.4   %    20 - 24
HUM:      63.6  mm     G. Age:  36w 6d         57  %
CER:      53.6  mm     G. Age:  N/A          > 95  %

LV:        7.1  mm

Est. FW:    0223  gm    7 lb 10 oz      82  %
Gestational Age

Clinical EDD:  38w 0d                                        EDD:   08/03/18
U/S Today:     38w 2d                                        EDD:   08/01/18
Best:          38w 0d     Det. By:  Clinical EDD             EDD:   08/03/18
Anatomy

Cranium:               Appears normal         Aortic Arch:            Not well visualized
Cavum:                 Appears normal         Ductal Arch:            Not well visualized
Ventricles:            Appears normal         Diaphragm:              Appears normal
Choroid Plexus:        Appears normal         Stomach:                Appears normal, left
sided
Cerebellum:            Appears normal         Abdomen:                Appears normal
Posterior Fossa:       Appears normal         Abdominal Wall:         Appears nml (cord
insert, abd wall)
Nuchal Fold:           Not applicable (>20    Cord Vessels:           Appears normal (3
wks GA)                                        vessel cord)
Face:                  Appears normal         Kidneys:                Appear normal
(orbits and profile)
Palate:                Not well visualized    Bladder:                Appears normal
Thoracic:              Appears normal         Spine:                  Appears normal
Heart:                 Appears normal         Upper Extremities:      Appears normal
(4CH, axis, and                                LUE not well seen
situs)
RVOT:                  Appears normal         Lower Extremities:      Appears normal
LVOT:                  Appears normal

Other:  Nasal bone visualized. Technicallly difficult due to advanced GA and
maternal habitus.
Cervix Uterus Adnexa

Cervix
Not visualized (advanced GA >06wks)

Left Ovary
Not visualized. No adnexal mass visualized.
Right Ovary
Not visualized. No adnexal mass visualized.
Impression

ultrasound.  Trimester anatomy scan performed at [facility] was reported as normal.

She has gestational diabetes and reports that her fasting
levels are high and that diabetes is not well controlled.  Is not
been checking her blood sugars regularly.  5.8%.
Obstetric history is significant for previous term vaginal
deliveries.

On ultrasound fetal growth is appropriate for gestational age.
Oligohydramnios is seen.  BPP [DATE]. Fetal anatomy is very
limited because of advanced gestational age and low
amniotic fluid.

I counseled the patient that poorly controlled diabetes and
oligohydramnios increase the risk of fetal adverse outcomes
including stillbirth.  I recommended delivery.

Patient agreed for delivery.  She was sent over to the
admitting.
